# Patient Record
Sex: Female | Born: 1980 | Hispanic: Yes | Marital: Single | Smoking: Never smoker
Health system: Southern US, Community
[De-identification: ages and names within clinical notes are randomized; demographics above are authoritative.]

## PROBLEM LIST (undated history)

## (undated) ENCOUNTER — Inpatient Hospital Stay (HOSPITAL_COMMUNITY): Payer: Self-pay

## (undated) DIAGNOSIS — O139 Gestational [pregnancy-induced] hypertension without significant proteinuria, unspecified trimester: Secondary | ICD-10-CM

## (undated) DIAGNOSIS — N750 Cyst of Bartholin's gland: Secondary | ICD-10-CM

## (undated) HISTORY — PX: THERAPEUTIC ABORTION: SHX798

---

## 2009-10-25 ENCOUNTER — Other Ambulatory Visit: Payer: Self-pay | Admitting: Emergency Medicine

## 2009-10-26 ENCOUNTER — Inpatient Hospital Stay (HOSPITAL_COMMUNITY)
Admission: AD | Admit: 2009-10-26 | Discharge: 2009-10-28 | Payer: Self-pay | Source: Home / Self Care | Admitting: Psychiatry

## 2009-10-26 ENCOUNTER — Emergency Department (HOSPITAL_COMMUNITY): Admission: EM | Admit: 2009-10-26 | Discharge: 2009-10-26 | Payer: Self-pay | Admitting: Emergency Medicine

## 2009-10-26 ENCOUNTER — Ambulatory Visit: Payer: Self-pay | Admitting: Psychiatry

## 2010-10-26 NOTE — L&D Delivery Note (Signed)
Delivery Note At 4:31 PM a viable and healthy female was delivered via Vaginal, Spontaneous Delivery (Presentation: Right Occiput Anterior).  APGAR: 9, 9; weight 8 lb 12.2 oz (3975 g).   Placenta status: Intact, Spontaneous.  Cord: 3 vessels with the following complications: None.  Cord pH: n/a No difficulty with shoulders, though they were tight.  Nuchal cord X 1, reduced prior to delivery of shoulders  NICU team present at birth. Examined baby and did not feel there was significant micrognathia.   Only 100cc amniotic fluid with delivery (s/p ROM)  Anesthesia: Epidural  Episiotomy: None Lacerations: None Suture Repair: n/a Est. Blood Loss (mL):   Mom to postpartum.  Baby to nursery-stable.  Wynelle Bourgeois 09/03/2011, 5:30 PM

## 2011-01-11 LAB — TSH: TSH: 2.43 u[IU]/mL (ref 0.350–4.500)

## 2011-01-26 LAB — ETHANOL: Alcohol, Ethyl (B): 49 mg/dL — ABNORMAL HIGH (ref 0–10)

## 2011-01-26 LAB — BASIC METABOLIC PANEL
BUN: 6 mg/dL (ref 6–23)
CO2: 24 mEq/L (ref 19–32)
Chloride: 110 mEq/L (ref 96–112)
Potassium: 3.1 mEq/L — ABNORMAL LOW (ref 3.5–5.1)

## 2011-01-26 LAB — CBC
HCT: 37.2 % (ref 36.0–46.0)
MCV: 86.7 fL (ref 78.0–100.0)
Platelets: 252 10*3/uL (ref 150–400)
RBC: 4.3 MIL/uL (ref 3.87–5.11)
WBC: 9.5 10*3/uL (ref 4.0–10.5)

## 2011-01-26 LAB — DIFFERENTIAL
Eosinophils Absolute: 0.1 10*3/uL (ref 0.0–0.7)
Eosinophils Relative: 1 % (ref 0–5)
Lymphs Abs: 2.2 10*3/uL (ref 0.7–4.0)
Monocytes Relative: 9 % (ref 3–12)

## 2011-01-26 LAB — RAPID URINE DRUG SCREEN, HOSP PERFORMED
Barbiturates: NOT DETECTED
Benzodiazepines: POSITIVE — AB
Opiates: NOT DETECTED

## 2011-03-02 ENCOUNTER — Other Ambulatory Visit: Payer: Self-pay | Admitting: Family Medicine

## 2011-03-02 DIAGNOSIS — Z3682 Encounter for antenatal screening for nuchal translucency: Secondary | ICD-10-CM

## 2011-03-02 LAB — ANTIBODY SCREEN: Antibody Screen: NEGATIVE

## 2011-03-02 LAB — HEPATITIS B SURFACE ANTIGEN: Hepatitis B Surface Ag: NEGATIVE

## 2011-03-02 LAB — ABO/RH: RH Type: POSITIVE

## 2011-03-02 LAB — RPR: RPR: NONREACTIVE

## 2011-03-02 LAB — RUBELLA ANTIBODY, IGM: Rubella: IMMUNE

## 2011-03-03 ENCOUNTER — Other Ambulatory Visit: Payer: Self-pay | Admitting: Family Medicine

## 2011-03-03 ENCOUNTER — Ambulatory Visit (HOSPITAL_COMMUNITY)
Admission: RE | Admit: 2011-03-03 | Discharge: 2011-03-03 | Disposition: A | Discharge status: Home / Self Care | Payer: Medicaid Other | Source: Ambulatory Visit | Attending: Family Medicine | Admitting: Family Medicine

## 2011-03-03 DIAGNOSIS — O3510X0 Maternal care for (suspected) chromosomal abnormality in fetus, unspecified, not applicable or unspecified: Secondary | ICD-10-CM | POA: Insufficient documentation

## 2011-03-03 DIAGNOSIS — Z3682 Encounter for antenatal screening for nuchal translucency: Secondary | ICD-10-CM

## 2011-03-03 DIAGNOSIS — O351XX0 Maternal care for (suspected) chromosomal abnormality in fetus, not applicable or unspecified: Secondary | ICD-10-CM | POA: Insufficient documentation

## 2011-03-03 DIAGNOSIS — O09299 Supervision of pregnancy with other poor reproductive or obstetric history, unspecified trimester: Secondary | ICD-10-CM | POA: Insufficient documentation

## 2011-03-03 DIAGNOSIS — Z3689 Encounter for other specified antenatal screening: Secondary | ICD-10-CM | POA: Insufficient documentation

## 2011-03-03 DIAGNOSIS — Z0489 Encounter for examination and observation for other specified reasons: Secondary | ICD-10-CM

## 2011-04-21 ENCOUNTER — Encounter (HOSPITAL_COMMUNITY): Payer: Self-pay

## 2011-04-21 ENCOUNTER — Ambulatory Visit (HOSPITAL_COMMUNITY)
Admission: RE | Admit: 2011-04-21 | Discharge: 2011-04-21 | Disposition: A | Discharge status: Home / Self Care | Payer: Medicaid Other | Source: Ambulatory Visit | Attending: Family Medicine | Admitting: Family Medicine

## 2011-04-21 ENCOUNTER — Other Ambulatory Visit: Payer: Self-pay | Admitting: Family Medicine

## 2011-04-21 DIAGNOSIS — O09299 Supervision of pregnancy with other poor reproductive or obstetric history, unspecified trimester: Secondary | ICD-10-CM | POA: Insufficient documentation

## 2011-04-21 DIAGNOSIS — Z363 Encounter for antenatal screening for malformations: Secondary | ICD-10-CM | POA: Insufficient documentation

## 2011-04-21 DIAGNOSIS — Z0489 Encounter for examination and observation for other specified reasons: Secondary | ICD-10-CM

## 2011-04-21 DIAGNOSIS — O358XX0 Maternal care for other (suspected) fetal abnormality and damage, not applicable or unspecified: Secondary | ICD-10-CM | POA: Insufficient documentation

## 2011-04-21 DIAGNOSIS — Z1389 Encounter for screening for other disorder: Secondary | ICD-10-CM | POA: Insufficient documentation

## 2011-06-02 ENCOUNTER — Ambulatory Visit (HOSPITAL_COMMUNITY)
Admission: RE | Admit: 2011-06-02 | Discharge: 2011-06-02 | Disposition: A | Discharge status: Home / Self Care | Payer: Medicaid Other | Source: Ambulatory Visit | Attending: Family Medicine | Admitting: Family Medicine

## 2011-06-02 DIAGNOSIS — O09299 Supervision of pregnancy with other poor reproductive or obstetric history, unspecified trimester: Secondary | ICD-10-CM | POA: Insufficient documentation

## 2011-06-02 DIAGNOSIS — IMO0002 Reserved for concepts with insufficient information to code with codable children: Secondary | ICD-10-CM

## 2011-06-02 DIAGNOSIS — Z0489 Encounter for examination and observation for other specified reasons: Secondary | ICD-10-CM

## 2011-06-02 DIAGNOSIS — Z3689 Encounter for other specified antenatal screening: Secondary | ICD-10-CM | POA: Insufficient documentation

## 2011-06-02 NOTE — Progress Notes (Signed)
Patient seen for ultrasound only appointment today.  Please see [facility] report for details.  

## 2011-06-19 ENCOUNTER — Emergency Department (HOSPITAL_COMMUNITY)
Admission: EM | Admit: 2011-06-19 | Discharge: 2011-06-20 | Disposition: A | Discharge status: Home / Self Care | Payer: Medicaid Other | Attending: Emergency Medicine | Admitting: Emergency Medicine

## 2011-06-19 DIAGNOSIS — O99891 Other specified diseases and conditions complicating pregnancy: Secondary | ICD-10-CM | POA: Insufficient documentation

## 2011-06-19 DIAGNOSIS — R0789 Other chest pain: Secondary | ICD-10-CM | POA: Insufficient documentation

## 2011-06-19 DIAGNOSIS — R000 Tachycardia, unspecified: Secondary | ICD-10-CM | POA: Insufficient documentation

## 2011-06-19 DIAGNOSIS — K219 Gastro-esophageal reflux disease without esophagitis: Secondary | ICD-10-CM | POA: Insufficient documentation

## 2011-06-19 DIAGNOSIS — R1013 Epigastric pain: Secondary | ICD-10-CM | POA: Insufficient documentation

## 2011-06-20 ENCOUNTER — Emergency Department (HOSPITAL_COMMUNITY): Payer: Medicaid Other

## 2011-06-20 LAB — COMPREHENSIVE METABOLIC PANEL
CO2: 22 mEq/L (ref 19–32)
Calcium: 8.8 mg/dL (ref 8.4–10.5)
Creatinine, Ser: <0.47 mg/dL — ABNORMAL LOW (ref 0.50–1.10)
Glucose, Bld: 101 mg/dL — ABNORMAL HIGH (ref 70–99)

## 2011-06-20 LAB — CBC
HCT: 35.1 % — ABNORMAL LOW (ref 36.0–46.0)
Hemoglobin: 12 g/dL (ref 12.0–15.0)
RDW: 13.9 % (ref 11.5–15.5)
WBC: 17.1 10*3/uL — ABNORMAL HIGH (ref 4.0–10.5)

## 2011-06-20 LAB — DIFFERENTIAL
Basophils Absolute: 0.1 10*3/uL (ref 0.0–0.1)
Eosinophils Relative: 2 % (ref 0–5)
Lymphocytes Relative: 11 % — ABNORMAL LOW (ref 12–46)
Neutro Abs: 13.5 10*3/uL — ABNORMAL HIGH (ref 1.7–7.7)

## 2011-06-26 LAB — HIV ANTIBODY (ROUTINE TESTING W REFLEX): HIV: NONREACTIVE

## 2011-08-07 ENCOUNTER — Other Ambulatory Visit: Payer: Self-pay | Admitting: Family Medicine

## 2011-08-07 ENCOUNTER — Other Ambulatory Visit (HOSPITAL_COMMUNITY): Payer: Self-pay | Admitting: Physician Assistant

## 2011-08-07 DIAGNOSIS — Z0489 Encounter for examination and observation for other specified reasons: Secondary | ICD-10-CM

## 2011-08-11 ENCOUNTER — Ambulatory Visit (HOSPITAL_COMMUNITY)
Admission: RE | Admit: 2011-08-11 | Discharge: 2011-08-11 | Disposition: A | Discharge status: Home / Self Care | Payer: Medicaid Other | Source: Ambulatory Visit | Attending: Family Medicine | Admitting: Family Medicine

## 2011-08-11 DIAGNOSIS — O09299 Supervision of pregnancy with other poor reproductive or obstetric history, unspecified trimester: Secondary | ICD-10-CM | POA: Insufficient documentation

## 2011-08-11 DIAGNOSIS — Z3689 Encounter for other specified antenatal screening: Secondary | ICD-10-CM | POA: Insufficient documentation

## 2011-08-11 DIAGNOSIS — Z0489 Encounter for examination and observation for other specified reasons: Secondary | ICD-10-CM

## 2011-08-11 NOTE — Progress Notes (Signed)
Obstetric ultrasound performed today.  Please see report in [facility]. 

## 2011-08-12 ENCOUNTER — Encounter (HOSPITAL_COMMUNITY): Payer: Self-pay

## 2011-08-12 NOTE — Progress Notes (Signed)
Encounter addended by: Marlana Latus, RN on: 08/12/2011  2:59 PM<BR>     Documentation filed: Chief Complaint Section, Episodes

## 2011-08-25 ENCOUNTER — Ambulatory Visit (HOSPITAL_COMMUNITY)
Admission: RE | Admit: 2011-08-25 | Discharge: 2011-08-25 | Disposition: A | Discharge status: Home / Self Care | Payer: Medicaid Other | Source: Ambulatory Visit | Attending: Family Medicine | Admitting: Family Medicine

## 2011-08-25 ENCOUNTER — Other Ambulatory Visit (HOSPITAL_COMMUNITY): Payer: Self-pay | Admitting: Maternal and Fetal Medicine

## 2011-08-25 DIAGNOSIS — Z0489 Encounter for examination and observation for other specified reasons: Secondary | ICD-10-CM

## 2011-08-25 DIAGNOSIS — O09299 Supervision of pregnancy with other poor reproductive or obstetric history, unspecified trimester: Secondary | ICD-10-CM | POA: Insufficient documentation

## 2011-08-25 DIAGNOSIS — Z3689 Encounter for other specified antenatal screening: Secondary | ICD-10-CM | POA: Insufficient documentation

## 2011-09-02 ENCOUNTER — Inpatient Hospital Stay (HOSPITAL_COMMUNITY): Payer: Medicaid Other

## 2011-09-02 ENCOUNTER — Inpatient Hospital Stay (HOSPITAL_COMMUNITY)
Admission: AD | Admit: 2011-09-02 | Discharge: 2011-09-05 | DRG: 774 | Disposition: A | Discharge status: Home / Self Care | Payer: Medicaid Other | Source: Ambulatory Visit | Attending: Obstetrics & Gynecology | Admitting: Obstetrics & Gynecology

## 2011-09-02 ENCOUNTER — Encounter (HOSPITAL_COMMUNITY): Payer: Self-pay | Admitting: *Deleted

## 2011-09-02 DIAGNOSIS — O409XX0 Polyhydramnios, unspecified trimester, not applicable or unspecified: Secondary | ICD-10-CM | POA: Diagnosis present

## 2011-09-02 DIAGNOSIS — O09899 Supervision of other high risk pregnancies, unspecified trimester: Secondary | ICD-10-CM

## 2011-09-02 DIAGNOSIS — O0990 Supervision of high risk pregnancy, unspecified, unspecified trimester: Secondary | ICD-10-CM

## 2011-09-02 DIAGNOSIS — Y92009 Unspecified place in unspecified non-institutional (private) residence as the place of occurrence of the external cause: Secondary | ICD-10-CM

## 2011-09-02 DIAGNOSIS — Z87898 Personal history of other specified conditions: Secondary | ICD-10-CM

## 2011-09-02 DIAGNOSIS — O4590 Premature separation of placenta, unspecified, unspecified trimester: Principal | ICD-10-CM | POA: Diagnosis present

## 2011-09-02 DIAGNOSIS — IMO0002 Reserved for concepts with insufficient information to code with codable children: Secondary | ICD-10-CM

## 2011-09-02 DIAGNOSIS — O289 Unspecified abnormal findings on antenatal screening of mother: Secondary | ICD-10-CM | POA: Diagnosis present

## 2011-09-02 DIAGNOSIS — W19XXXA Unspecified fall, initial encounter: Secondary | ICD-10-CM | POA: Diagnosis present

## 2011-09-02 DIAGNOSIS — W010XXA Fall on same level from slipping, tripping and stumbling without subsequent striking against object, initial encounter: Secondary | ICD-10-CM | POA: Diagnosis present

## 2011-09-02 LAB — CBC
HCT: 36.8 % (ref 36.0–46.0)
Hemoglobin: 11.9 g/dL — ABNORMAL LOW (ref 12.0–15.0)
MCH: 29 pg (ref 26.0–34.0)
MCHC: 32.3 g/dL (ref 30.0–36.0)
MCV: 89.5 fL (ref 78.0–100.0)

## 2011-09-02 MED ORDER — OXYTOCIN 20 UNITS IN LACTATED RINGERS INFUSION - SIMPLE
125.0000 mL/h | Freq: Once | INTRAVENOUS | Status: AC
  Administered 2011-09-03: 125 mL/h via INTRAVENOUS

## 2011-09-02 MED ORDER — ONDANSETRON HCL 4 MG/2ML IJ SOLN: 4.0000 mg | Freq: Four times a day (QID) | INTRAMUSCULAR | Status: DC | PRN

## 2011-09-02 MED ORDER — HYDROXYZINE HCL 50 MG PO TABS: 50.0000 mg | ORAL_TABLET | Freq: Four times a day (QID) | ORAL | Status: DC | PRN

## 2011-09-02 MED ORDER — LIDOCAINE HCL (PF) 1 % IJ SOLN
30.0000 mL | INTRAMUSCULAR | Status: DC | PRN
  Filled 2011-09-02: qty 30

## 2011-09-02 MED ORDER — LACTATED RINGERS IV SOLN
500.0000 mL | INTRAVENOUS | Status: DC | PRN
  Administered 2011-09-03: 1000 mL via INTRAVENOUS

## 2011-09-02 MED ORDER — OXYTOCIN BOLUS FROM INFUSION
500.0000 mL | Freq: Once | INTRAVENOUS | Status: DC
  Filled 2011-09-02: qty 500

## 2011-09-02 MED ORDER — OXYTOCIN 20 UNITS IN LACTATED RINGERS INFUSION - SIMPLE
1.0000 m[IU]/min | INTRAVENOUS | Status: DC
  Administered 2011-09-03: 2 m[IU]/min via INTRAVENOUS
  Filled 2011-09-02: qty 1000

## 2011-09-02 MED ORDER — CITRIC ACID-SODIUM CITRATE 334-500 MG/5ML PO SOLN: 30.0000 mL | ORAL | Status: DC | PRN

## 2011-09-02 MED ORDER — IBUPROFEN 600 MG PO TABS: 600.0000 mg | ORAL_TABLET | Freq: Four times a day (QID) | ORAL | Status: DC | PRN

## 2011-09-02 MED ORDER — OXYCODONE-ACETAMINOPHEN 5-325 MG PO TABS: 2.0000 | ORAL_TABLET | ORAL | Status: DC | PRN

## 2011-09-02 MED ORDER — ZOLPIDEM TARTRATE 10 MG PO TABS: 10.0000 mg | ORAL_TABLET | Freq: Every evening | ORAL | Status: DC | PRN

## 2011-09-02 MED ORDER — TERBUTALINE SULFATE 1 MG/ML IJ SOLN: 0.2500 mg | Freq: Once | INTRAMUSCULAR | Status: AC | PRN

## 2011-09-02 MED ORDER — FLEET ENEMA 7-19 GM/118ML RE ENEM: 1.0000 | ENEMA | RECTAL | Status: DC | PRN

## 2011-09-02 MED ORDER — LACTATED RINGERS IV SOLN
INTRAVENOUS | Status: DC
  Administered 2011-09-02 – 2011-09-03 (×3): via INTRAVENOUS

## 2011-09-02 MED ORDER — ACETAMINOPHEN 325 MG PO TABS: 650.0000 mg | ORAL_TABLET | ORAL | Status: DC | PRN

## 2011-09-02 MED ORDER — HYDROXYZINE HCL 50 MG/ML IM SOLN: 50.0000 mg | Freq: Four times a day (QID) | INTRAMUSCULAR | Status: DC | PRN

## 2011-09-02 MED ORDER — NALBUPHINE SYRINGE 5 MG/0.5 ML: 5.0000 mg | INJECTION | INTRAMUSCULAR | Status: DC | PRN

## 2011-09-02 NOTE — Plan of Care (Signed)
Problem: Consults Goal: Birthing Suites Patient Information Press F2 to bring up selections list Outcome: Completed/Met Date Met:  09/02/11  Pt 37-[redacted] weeks EGA, Inpatient induction, Fetal indication and Other (specify with a note) pt fell at home & has partial abruption now.

## 2011-09-02 NOTE — Progress Notes (Signed)
Pt admitted for IOL 2/2 presumed marginal abruption after a fall.  U/S showed no evidence of abruption.  FHR 140'S, AVG LTV, + accels, no decels.  No contractions.  CX 2-3/long/-2/vtx.  Foley placed and inflated with 60 ccc H20.  Will start Pitocin when foley falls out or 0500, whichever comes first.
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
## 2011-09-02 NOTE — Progress Notes (Signed)
Patient states that she fell going down stairs at about 0730 and hit on her buttocks. Started having abdominal pain and leaking a little fluid and having spotting, started as red not more brown. Reports good fetal movement.

## 2011-09-02 NOTE — Progress Notes (Addendum)
Anita Harrison is a 30 y.o. G3P1011 at [redacted]w[redacted]d.  Subjective: Mild-mod low abd cramping, worsening  Objective: BP 126/77  Pulse 91  Temp(Src) 97.2 F (36.2 C) (Oral)  Resp 16  Ht 5' 4.5" (1.638 m)  Wt 93.622 kg (206 lb 6.4 oz)  BMI 34.88 kg/m2  SpO2 98%  LMP 12/07/2010      FHT:  FHR: 130 bpm, variability: moderate,  accelerations:  Present,  decelerations:  Absent UC:   irregular SVE:   Dilation: 3 Effacement (%):  (long) Exam by:: V. Shakim Faith, cnm Abd soft, NT Pelvic: mod amount of thick, light-brown discharge. [place]: No evidence of abruption  Labs: Lab Results  Component Value Date   WBC 17.1* 06/20/2011   HGB 12.0 06/20/2011   HCT 35.1* 06/20/2011   MCV 88.0 06/20/2011   PLT 228 06/20/2011    Assessment / Plan: Plan IOL for suspected abruption after fall  Labor: NA Preeclampsia:  NA Fetal Wellbeing:  Category I Pain Control:  Labor support without medications I/D:  n/a Anticipated MOD:  NSVD  Christie Copley 09/02/2011, 7:29 PM

## 2011-09-03 ENCOUNTER — Encounter (HOSPITAL_COMMUNITY): Payer: Self-pay | Admitting: *Deleted

## 2011-09-03 ENCOUNTER — Encounter (HOSPITAL_COMMUNITY): Payer: Self-pay | Admitting: Anesthesiology

## 2011-09-03 ENCOUNTER — Inpatient Hospital Stay (HOSPITAL_COMMUNITY): Payer: Medicaid Other | Admitting: Anesthesiology

## 2011-09-03 DIAGNOSIS — O0990 Supervision of high risk pregnancy, unspecified, unspecified trimester: Secondary | ICD-10-CM

## 2011-09-03 DIAGNOSIS — O4590 Premature separation of placenta, unspecified, unspecified trimester: Secondary | ICD-10-CM

## 2011-09-03 MED ORDER — PHENYLEPHRINE 40 MCG/ML (10ML) SYRINGE FOR IV PUSH (FOR BLOOD PRESSURE SUPPORT)
80.0000 ug | PREFILLED_SYRINGE | INTRAVENOUS | Status: DC | PRN
  Filled 2011-09-03: qty 5

## 2011-09-03 MED ORDER — DIPHENHYDRAMINE HCL 50 MG/ML IJ SOLN: 12.5000 mg | INTRAMUSCULAR | Status: DC | PRN

## 2011-09-03 MED ORDER — TETANUS-DIPHTH-ACELL PERTUSSIS 5-2.5-18.5 LF-MCG/0.5 IM SUSP
0.5000 mL | Freq: Once | INTRAMUSCULAR | Status: AC
  Administered 2011-09-04: 0.5 mL via INTRAMUSCULAR
  Filled 2011-09-03: qty 0.5

## 2011-09-03 MED ORDER — EPHEDRINE 5 MG/ML INJ
10.0000 mg | INTRAVENOUS | Status: DC | PRN
  Filled 2011-09-03: qty 4

## 2011-09-03 MED ORDER — ONDANSETRON HCL 4 MG PO TABS: 4.0000 mg | ORAL_TABLET | ORAL | Status: DC | PRN

## 2011-09-03 MED ORDER — PRENATAL PLUS 27-1 MG PO TABS
1.0000 | ORAL_TABLET | Freq: Every day | ORAL | Status: DC
  Administered 2011-09-04: 1 via ORAL
  Filled 2011-09-03: qty 1

## 2011-09-03 MED ORDER — BENZOCAINE-MENTHOL 20-0.5 % EX AERO: 1.0000 "application " | INHALATION_SPRAY | CUTANEOUS | Status: DC | PRN

## 2011-09-03 MED ORDER — DIBUCAINE 1 % RE OINT: 1.0000 "application " | TOPICAL_OINTMENT | RECTAL | Status: DC | PRN

## 2011-09-03 MED ORDER — LANOLIN HYDROUS EX OINT: TOPICAL_OINTMENT | CUTANEOUS | Status: DC | PRN

## 2011-09-03 MED ORDER — OXYCODONE-ACETAMINOPHEN 5-325 MG PO TABS: 1.0000 | ORAL_TABLET | ORAL | Status: DC | PRN

## 2011-09-03 MED ORDER — SENNOSIDES-DOCUSATE SODIUM 8.6-50 MG PO TABS
2.0000 | ORAL_TABLET | Freq: Every day | ORAL | Status: DC
  Administered 2011-09-03 – 2011-09-04 (×2): 2 via ORAL

## 2011-09-03 MED ORDER — FENTANYL 2.5 MCG/ML BUPIVACAINE 1/10 % EPIDURAL INFUSION (WH - ANES)
INTRAMUSCULAR | Status: DC | PRN
  Administered 2011-09-03: 14 mL/h via EPIDURAL

## 2011-09-03 MED ORDER — FENTANYL 2.5 MCG/ML BUPIVACAINE 1/10 % EPIDURAL INFUSION (WH - ANES)
14.0000 mL/h | INTRAMUSCULAR | Status: DC
  Filled 2011-09-03: qty 60

## 2011-09-03 MED ORDER — LACTATED RINGERS IV SOLN
500.0000 mL | Freq: Once | INTRAVENOUS | Status: AC
  Administered 2011-09-03: 500 mL via INTRAVENOUS

## 2011-09-03 MED ORDER — ONDANSETRON HCL 4 MG/2ML IJ SOLN: 4.0000 mg | INTRAMUSCULAR | Status: DC | PRN

## 2011-09-03 MED ORDER — EPHEDRINE 5 MG/ML INJ: 10.0000 mg | INTRAVENOUS | Status: DC | PRN

## 2011-09-03 MED ORDER — IBUPROFEN 600 MG PO TABS
600.0000 mg | ORAL_TABLET | Freq: Four times a day (QID) | ORAL | Status: DC
  Administered 2011-09-04 – 2011-09-05 (×6): 600 mg via ORAL
  Filled 2011-09-03 (×6): qty 1

## 2011-09-03 MED ORDER — WITCH HAZEL-GLYCERIN EX PADS: 1.0000 "application " | MEDICATED_PAD | CUTANEOUS | Status: DC | PRN

## 2011-09-03 MED ORDER — SODIUM BICARBONATE 8.4 % IV SOLN
INTRAVENOUS | Status: DC | PRN
  Administered 2011-09-03: 5 mL via EPIDURAL

## 2011-09-03 MED ORDER — PRENATAL PLUS 27-1 MG PO TABS: 1.0000 | ORAL_TABLET | Freq: Every day | ORAL | Status: DC

## 2011-09-03 MED ORDER — PHENYLEPHRINE 40 MCG/ML (10ML) SYRINGE FOR IV PUSH (FOR BLOOD PRESSURE SUPPORT): 80.0000 ug | PREFILLED_SYRINGE | INTRAVENOUS | Status: DC | PRN

## 2011-09-03 MED ORDER — SIMETHICONE 80 MG PO CHEW: 80.0000 mg | CHEWABLE_TABLET | ORAL | Status: DC | PRN

## 2011-09-03 MED ORDER — DIPHENHYDRAMINE HCL 25 MG PO CAPS: 25.0000 mg | ORAL_CAPSULE | Freq: Four times a day (QID) | ORAL | Status: DC | PRN

## 2011-09-03 MED ORDER — ZOLPIDEM TARTRATE 5 MG PO TABS: 5.0000 mg | ORAL_TABLET | Freq: Every evening | ORAL | Status: DC | PRN

## 2011-09-03 NOTE — Anesthesia Preprocedure Evaluation (Signed)
Anesthesia Evaluation  Patient identified by MRN, date of birth, ID band Patient awake    Reviewed: Allergy & Precautions, H&P , Patient's Chart, lab work & pertinent test results  Airway Mallampati: II TM Distance: >3 FB Neck ROM: full    Dental  (+) Teeth Intact   Pulmonary  clear to auscultation        Cardiovascular regular Normal    Neuro/Psych    GI/Hepatic   Endo/Other    Renal/GU      Musculoskeletal   Abdominal   Peds  Hematology   Anesthesia Other Findings       Reproductive/Obstetrics (+) Pregnancy                           Anesthesia Physical Anesthesia Plan  ASA: III  Anesthesia Plan: Epidural   Post-op Pain Management:    Induction:   Airway Management Planned:   Additional Equipment:   Intra-op Plan:   Post-operative Plan:   Informed Consent:   Plan Discussed with:   Anesthesia Plan Comments:         Anesthesia Quick Evaluation

## 2011-09-03 NOTE — Progress Notes (Signed)
Foley out--CX 5/50/-2/vtx.  FHR reactive without decels.  No contractions. Will start pitocin

## 2011-09-03 NOTE — Anesthesia Procedure Notes (Signed)
Epidural Patient location during procedure: OB  Preanesthetic Checklist Completed: patient identified, site marked, surgical consent, pre-op evaluation, timeout performed, IV checked, risks and benefits discussed and monitors and equipment checked  Epidural Patient position: sitting Prep: site prepped and draped and DuraPrep Patient monitoring: continuous pulse ox and blood pressure Approach: midline Injection technique: LOR air  Needle:  Needle type: Tuohy  Needle gauge: 17 G Needle length: 9 cm Needle insertion depth: 6 cm Catheter type: closed end flexible Catheter size: 19 Gauge Catheter at skin depth: 12 cm Test dose: negative  Assessment Events: blood not aspirated, injection not painful, no injection resistance, negative IV test and no paresthesia  Additional Notes Dosing of Epidural:  1st dose, through needle ............................................. epi 1:200K + Xylocaine 40 mg  2nd dose, through catheter, after waiting 3 minutes.....epi 1:200K + Xylocaine 60 mg  3rd dose, through catheter after waiting 3 minutes .............................Marcaine   5mg   ( mg Marcaine are expressed as equivilent  cc's medication removed from the 0.1%Bupiv / fentanyl syringe from L&D pump)  ( 2% Xylo charted as a single dose in Epic Meds for ease of charting; actual dosing was fractionated as above, for saftey's sake)  As each dose occurred, patient was free of IV sx; and patient exhibited no evidence of SA injection.  Patient is more comfortable after epidural dosed. Please see RN's note for documentation of vital signs,and FHR which are stable.       

## 2011-09-03 NOTE — Progress Notes (Signed)
Pt sleeping.  Only brownish blood when pt wipes after using bathroom. FHR reactive without decels.  Foley still in.

## 2011-09-03 NOTE — Progress Notes (Signed)
Pt sleeping.  FHR 140's, avg LTV, + accels, no decels.  Occ contraction.  Foley still in.

## 2011-09-04 NOTE — Anesthesia Postprocedure Evaluation (Signed)
  Anesthesia Post-op Note  Patient: Anita Harrison  Procedure(s) Performed: * No procedures listed *  Patient Location: Mother/Baby  Anesthesia Type: Epidural  Level of Consciousness: awake, alert  and oriented  Airway and Oxygen Therapy: Patient Spontanous Breathing  Post-op Pain: none  Post-op Assessment: Post-op Vital signs reviewed, Patient's Cardiovascular Status Stable, No headache, No backache, No residual numbness and No residual motor weakness  Post-op Vital Signs: Reviewed and stable  Complications: No apparent anesthesia complications

## 2011-09-04 NOTE — Progress Notes (Signed)
UR chart review completed.  

## 2011-09-04 NOTE — Progress Notes (Signed)
Post Partum Day 1 for SVD  Subjective: no complaints, up ad lib, voiding, tolerating PO and + flatus, denies headache, changes in vision, abdominal pain in RUQ, shortness of breath  Objective: Blood pressure 131/79, pulse 99, temperature 98.5 F (36.9 C), temperature source Oral, resp. rate 16, height 5' 4.5" (1.638 m), weight 93.622 kg (206 lb 6.4 oz), last menstrual period 12/07/2010, SpO2 100.00%, unknown if currently breastfeeding.  Temp:  [97.8 F (36.6 C)-98.6 F (37 C)] 98.5 F (36.9 C) (11/09 0036) Pulse Rate:  [73-122] 99  (11/09 0036) Resp:  [16-20] 16  (11/09 0036) BP: (95-168)/(58-109) 131/79 mmHg (11/09 0036) SpO2:  [99 %-100 %] 100 % (11/08 1314)   Physical Exam:  General: alert, cooperative, appears stated age and no distress Lochia: appropriate Uterine Fundus: firm DVT Evaluation: No evidence of DVT seen on physical exam. Cardiac: RRR, no murmurs Lungs: CTA-B Neuro: DTR 2+ bilaterally    Basename 09/02/11 1942  HGB 11.9*  HCT 36.8    Assessment/Plan: Plan for discharge tomorrow and Breastfeeding   LOS: 2 days   Mat Carne 09/04/2011, 7:31 AM

## 2011-09-04 NOTE — H&P (Signed)
I was present for the exam and agree with above.  Dorathy Kinsman 09/04/2011 6:59 AM

## 2011-09-05 MED ORDER — IBUPROFEN 600 MG PO TABS: 600.0000 mg | ORAL_TABLET | Freq: Four times a day (QID) | ORAL | Status: AC

## 2011-09-05 NOTE — Discharge Summary (Signed)
Obstetric Discharge Summary Reason for Admission: induction of labor and Post- fall w/ suspicion for abruption Prenatal Procedures: ultrasound Intrapartum Procedures: spontaneous vaginal delivery Postpartum Procedures: none Complications-Operative and Postpartum: none Hemoglobin  Date Value Range Status  09/02/2011 11.9* 12.0-15.0 (g/dL) Final     HCT  Date Value Range Status  09/02/2011 36.8  36.0-46.0 (%) Final    Discharge Diagnoses: Term Pregnancy-delivered  Discharge Information: Date: 09/05/2011 Activity: pelvic rest Diet: routine Medications: Ibuprofen Condition: stable Instructions: refer to practice specific booklet Discharge to: home Follow-up Information    Follow up with [facility] [place] in 5 weeks. (or [facility] as needed)    Contact information:   [address] [employer] [place] [address]          Newborn Data: Live born female  Birth Weight: 8 lb 12.2 oz (3975 g) APGAR: 9, 9  Home with mother.  Anita Harrison 09/05/2011, 9:32 AM

## 2011-09-05 NOTE — Progress Notes (Signed)
Post Partum Day 2 Subjective: no complaints, up ad lib, voiding, tolerating PO and + flatus  Objective: Blood pressure 101/65, pulse 88, temperature 97.6 F (36.4 C), temperature source Axillary, resp. rate 18, height 5' 4.5" (1.638 m), weight 93.622 kg (206 lb 6.4 oz), last menstrual period 12/07/2010, SpO2 100.00%, unknown if currently breastfeeding.  Physical Exam:  General: alert, cooperative and no distress Lochia: appropriate Uterine Fundus: firm Incision: NA DVT Evaluation: No evidence of DVT seen on physical exam.   Basename 09/02/11 1942  HGB 11.9*  HCT 36.8    Assessment/Plan: Discharge home, Breastfeeding and Contraception Mirena   LOS: 3 days   Anita Harrison 09/05/2011, 9:26 AM

## 2011-09-05 NOTE — Discharge Summary (Signed)
Attestation of Attending Supervision of Advanced Practitioner: Evaluation and management procedures were performed by the PA/NP/CNM/OB Fellow under my supervision/collaboration. Chart reviewed, and agree with management and plan.  Jaynie Collins, M.D. 09/05/2011 9:42 AM
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
## 2012-04-11 IMAGING — US US ABDOMEN COMPLETE
1 series · 13 of 25 positions shown · non-contrast
Comparison: None.

CLINICAL DATA: Chest pain, evaluate for gallstones.  The patient
is 7 months pregnant.

COMPLETE ABDOMINAL ULTRASOUND

[Series 1: us abdomen complete · 0.32mm/px · 13 of 54 slices shown]
[im 1/54]
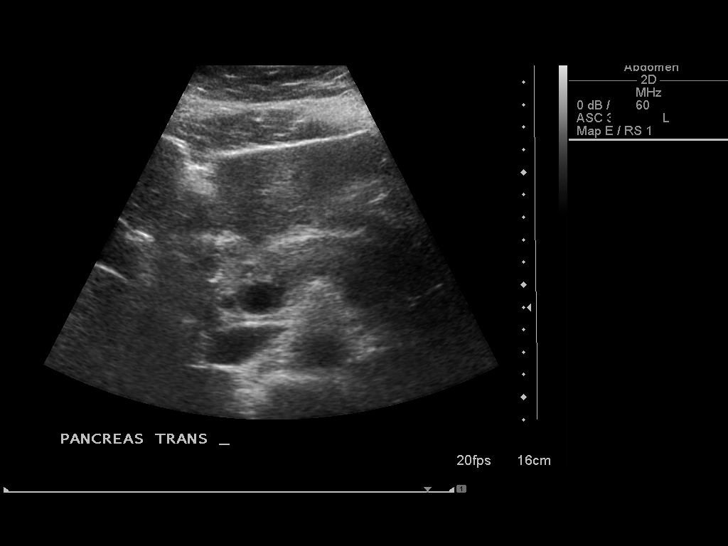
[im 5/54]
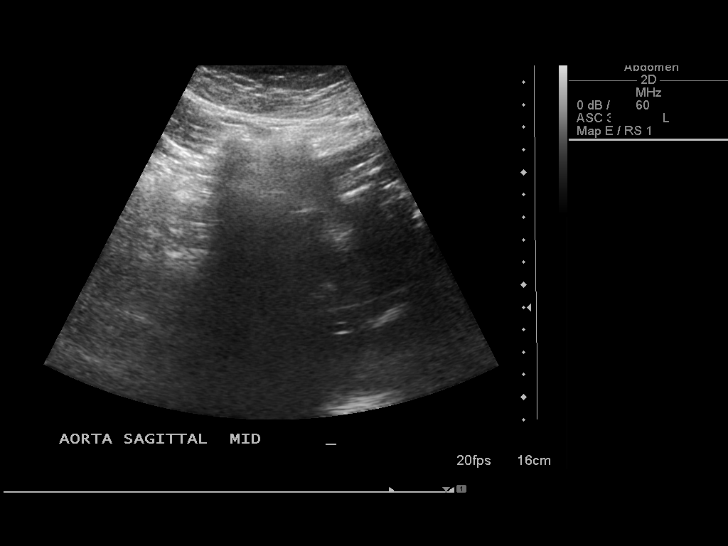
[im 9/54]
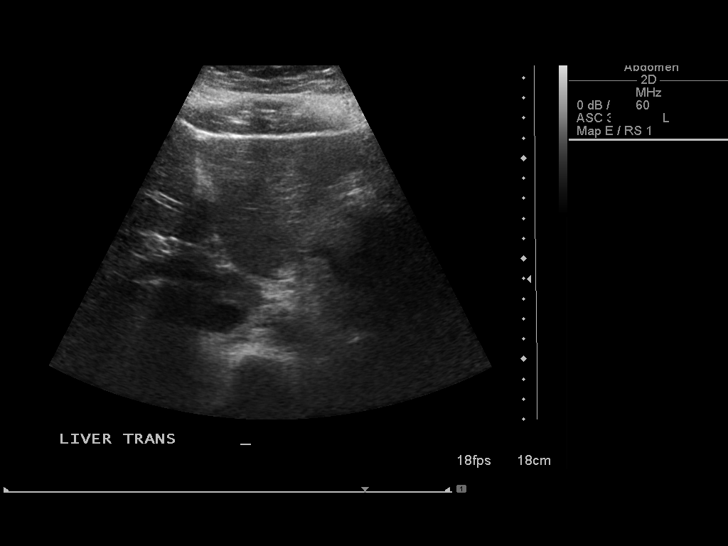
[im 14/54]
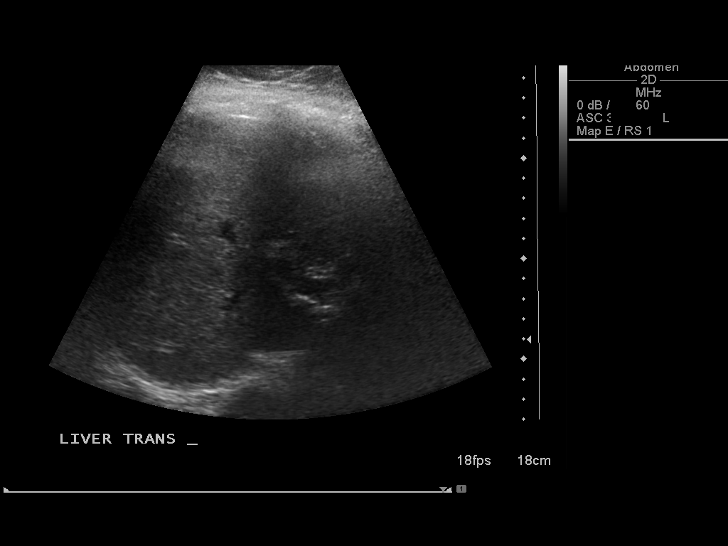
[im 18/54]
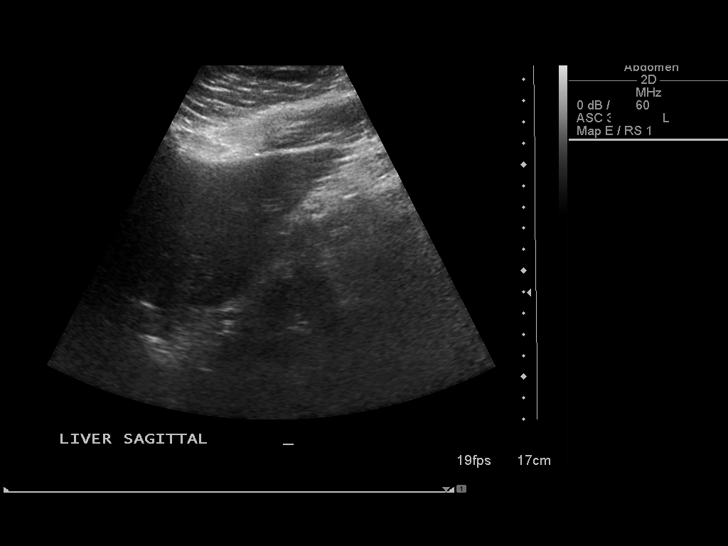
[im 23/54]
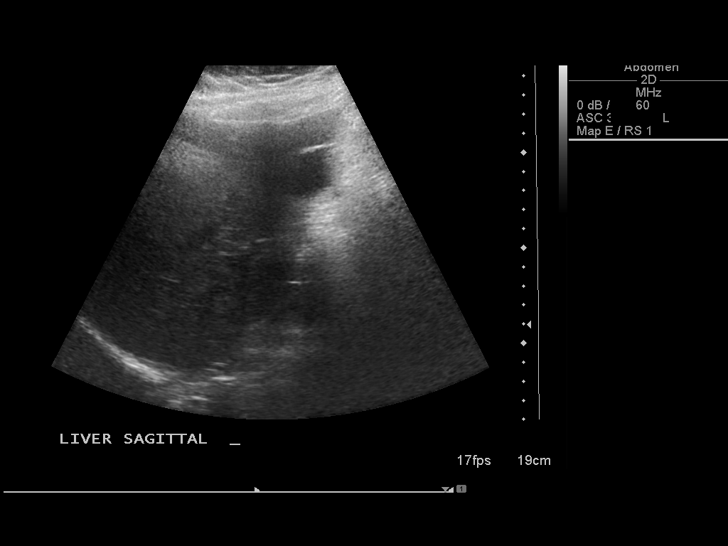
[im 27/54]
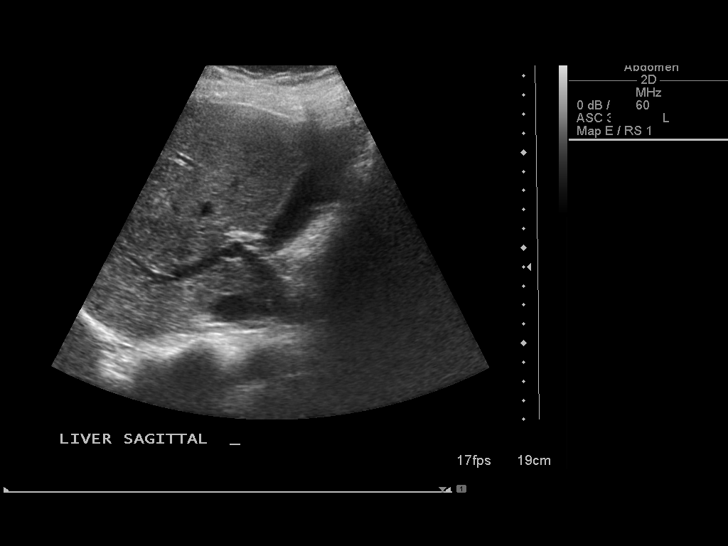
[im 31/54]
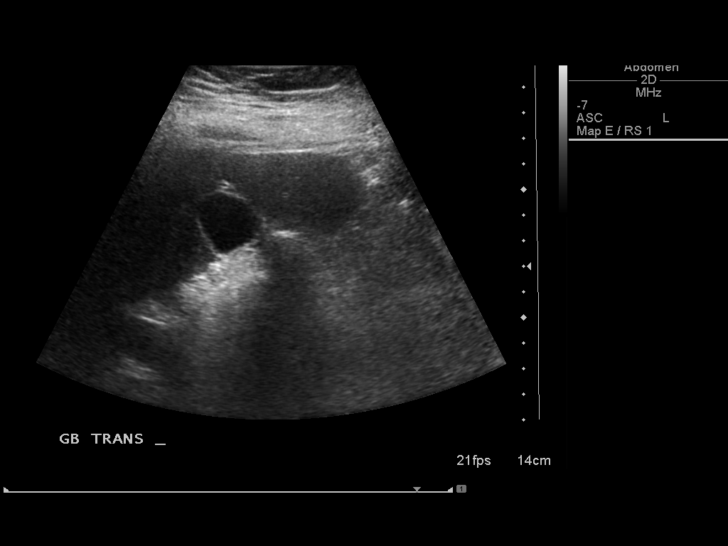
[im 36/54]
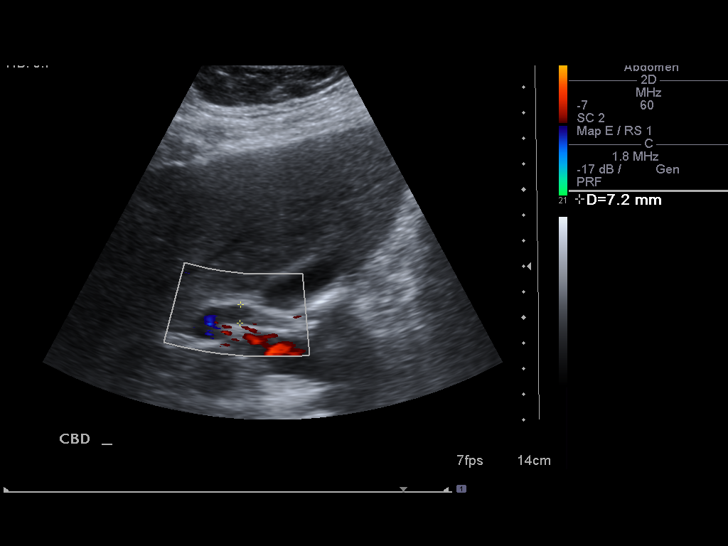
[im 40/54]
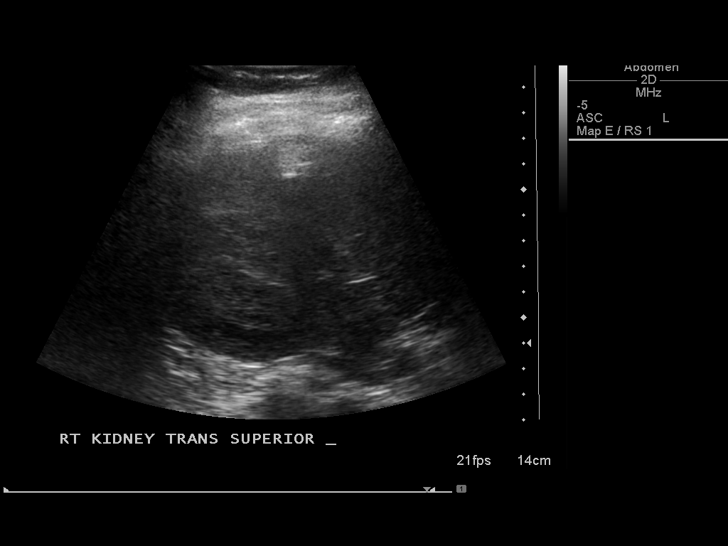
[im 45/54]
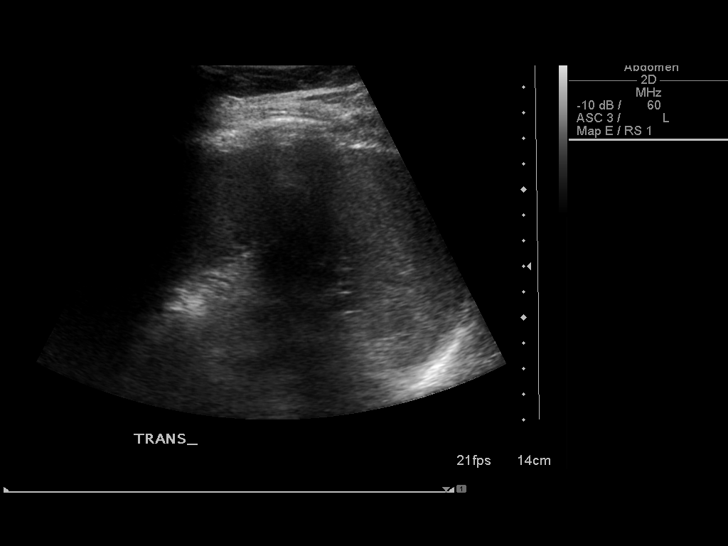
[im 49/54]
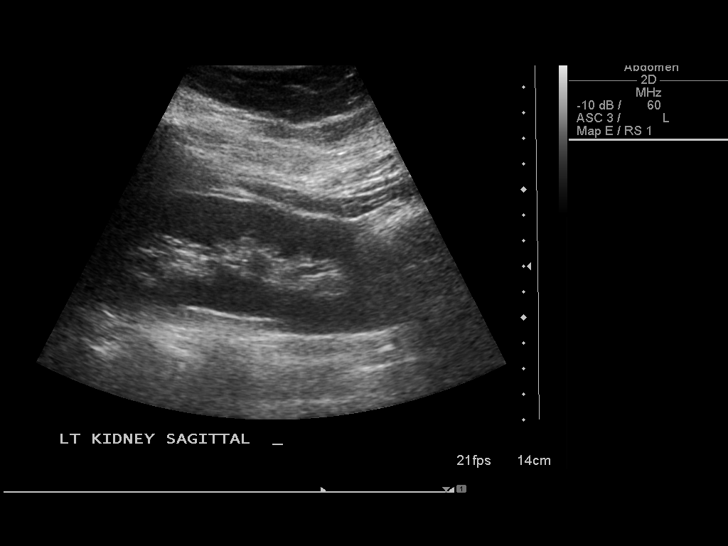
[im 54/54]
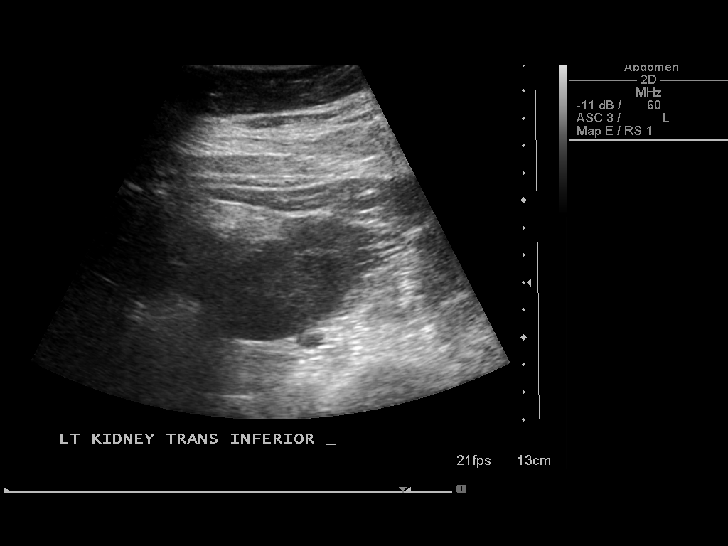

[13 of 25 positions shown; findings below may reference images not displayed]

FINDINGS: Gallbladder:  No gallstones, gallbladder wall thickening, or
pericholecystic fluid.

Common bile duct:  Slightly prominent proximally at 7 mm.  No
obstructing lesion identified.  The distal duct is obscured by
overlying bowel gas artifact.

Liver:  No focal lesion identified.  Within normal limits in
parenchymal echogenicity.

IVC:  Appears normal.

Pancreas:  Limited visualization of the distal body and tail
secondary to overlying bowel gas artifact.  No focal abnormality
appreciated within the proximal portions.

Spleen:  Mildly prominent, measuring up to 13.7 cm.

Right Kidney:  Normal echogenicity, measuring 11.2 cm.  No
hydronephrosis.

Left Kidney:  Normal echogenicity.  No hydronephrosis.  Measures
14.0 cm.

Abdominal aorta:  Limited visualization of the mid and distal aorta
secondary to overlying bowel gas and the gravid uterus.
IMPRESSION: No gallstones or sonographic evidence for cholecystitis.

Mildly prominent common bile duct up to 7 mm.  Appears to taper as
expected toward the ampulla however the distal aspect is obscured
by overlying bowel gas artifact. Correlate with LFTs.

Mild splenomegaly.

## 2012-06-16 IMAGING — US US OB LIMITED
1 series · 14 of 28 positions shown · non-contrast
Comparison: none

[Series 1: us ob limited · 0.23mm/px · 33 acquisitions, 14 frames shown]
[im 2/33]
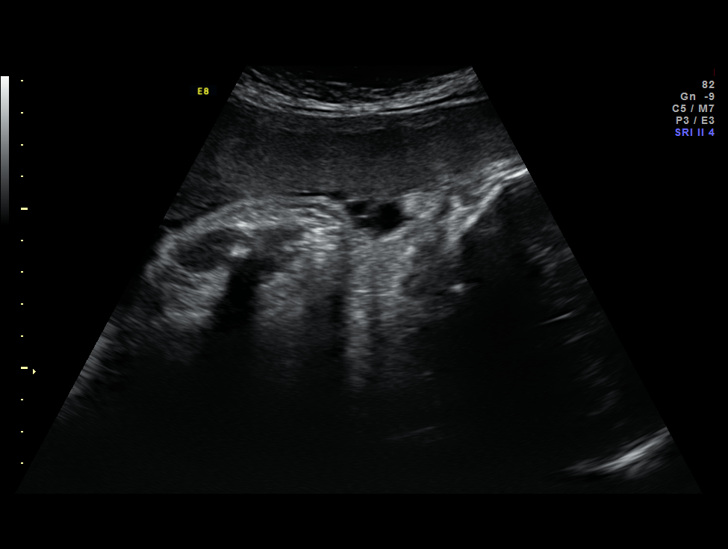
[im 4/33]
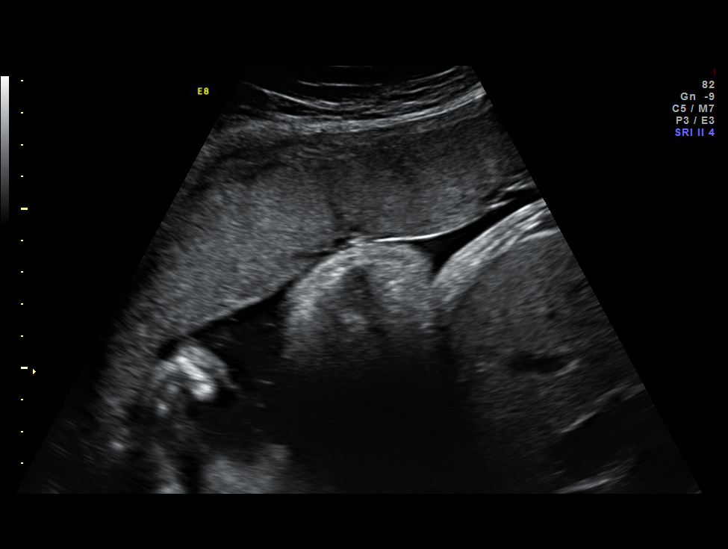
[im 6/33]
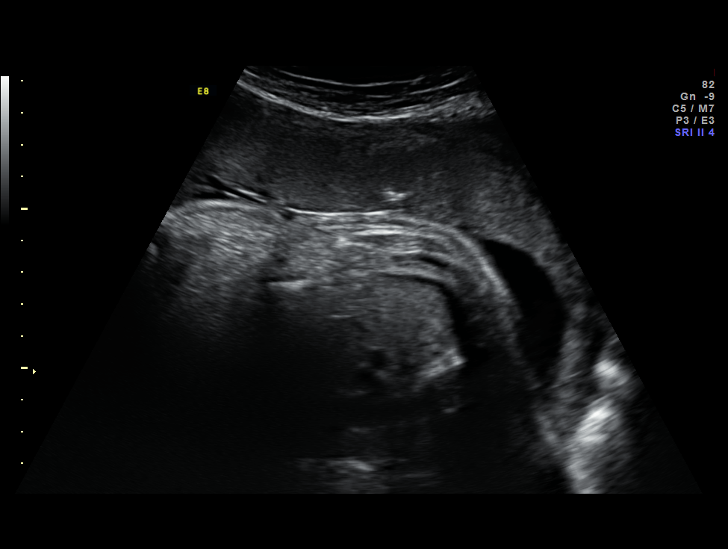
[im 9/33]
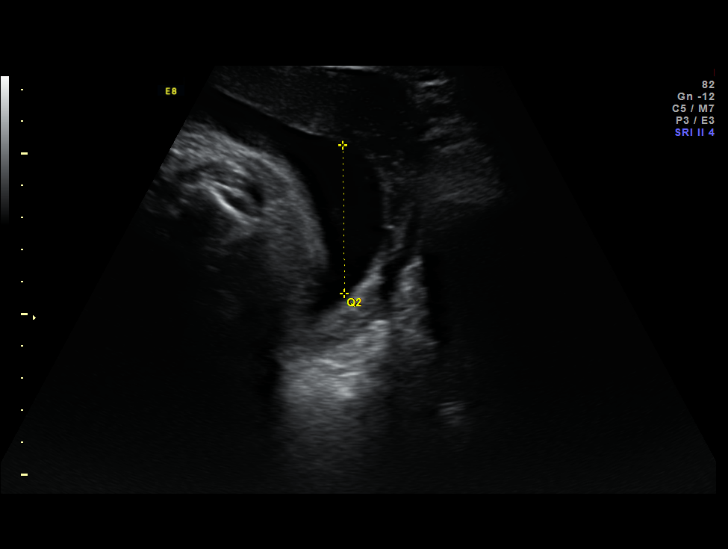
[im 11/33]
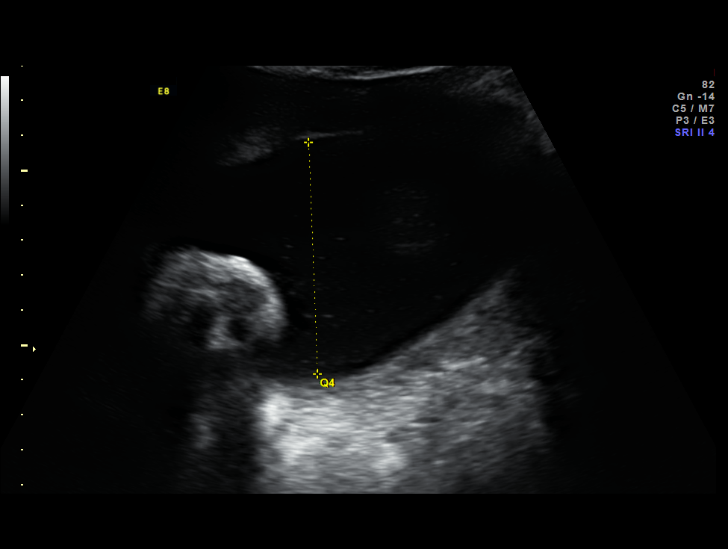
[im 14/33]
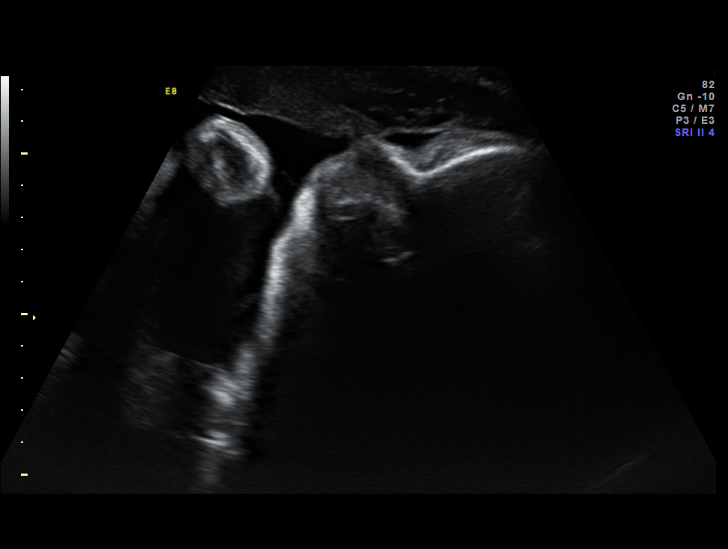
[im 16/33]
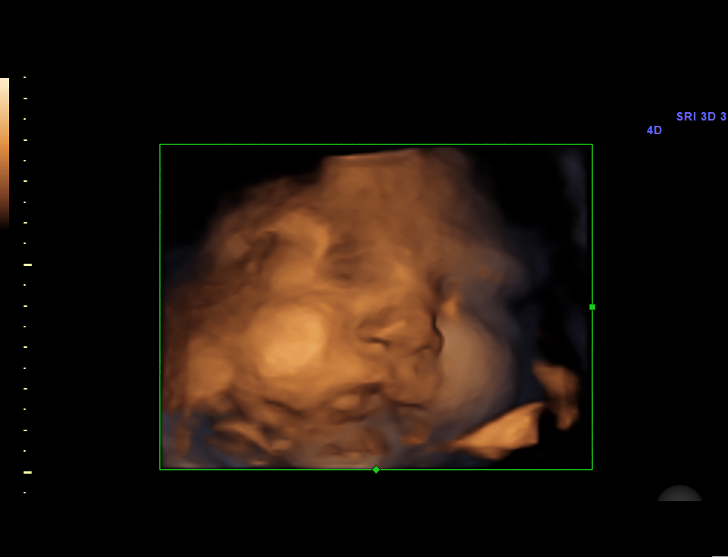
[im 18/33]
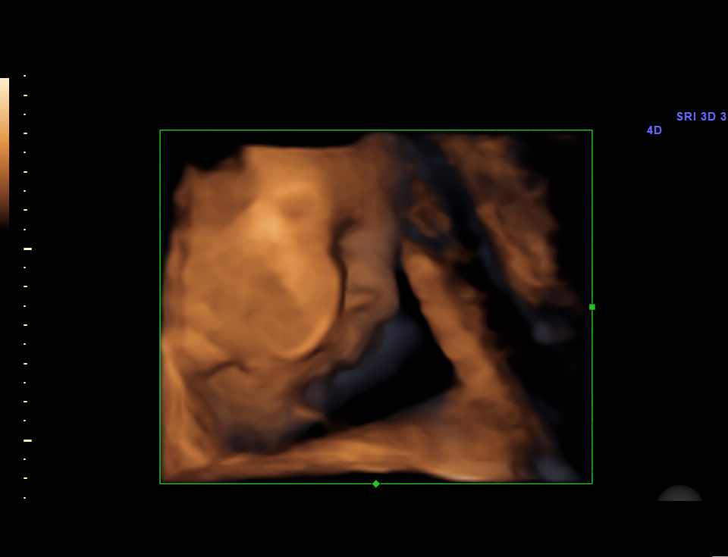
[im 21/33]
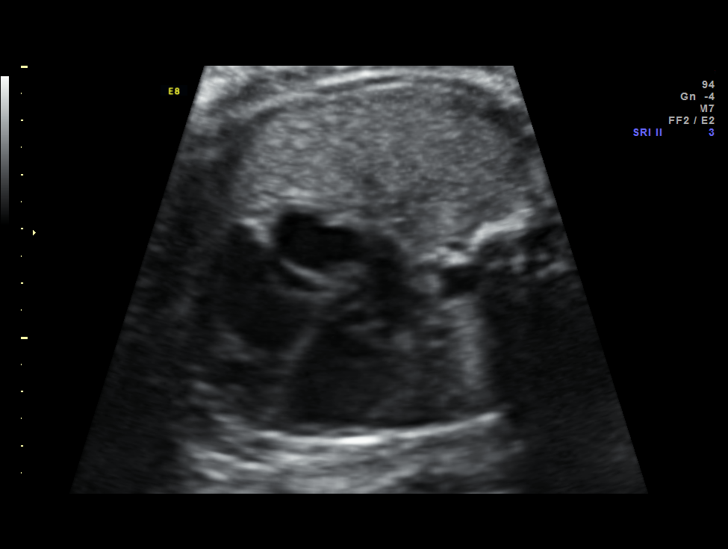
[im 23/33]
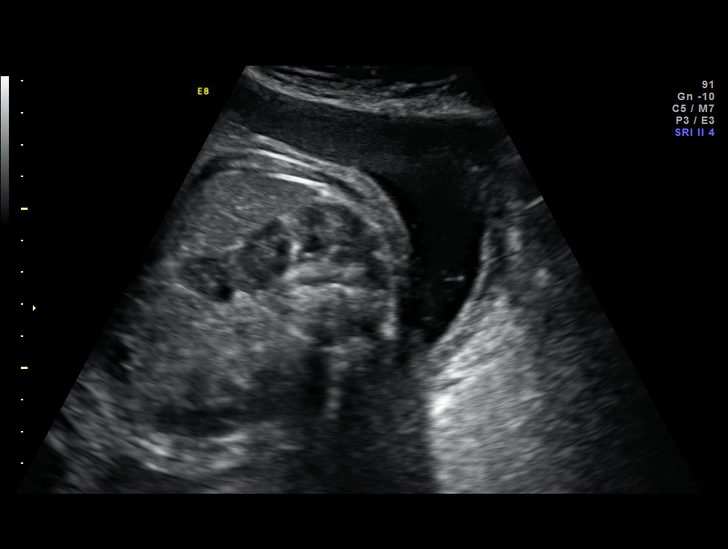
[im 25/33]
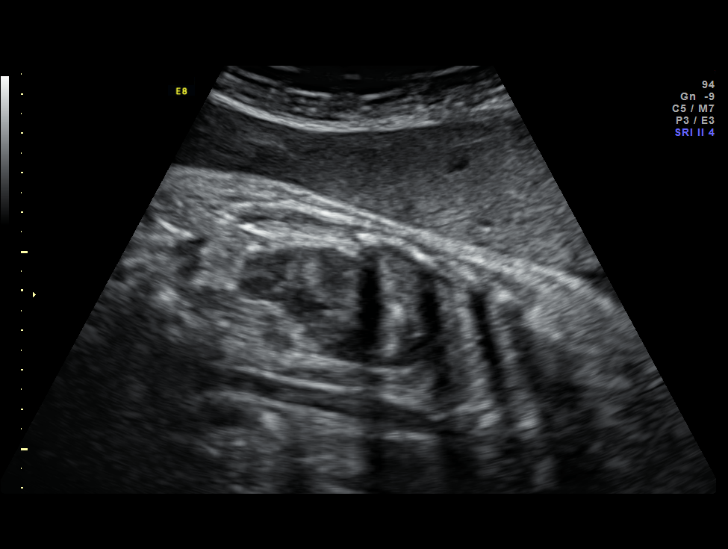
[im 28/33]
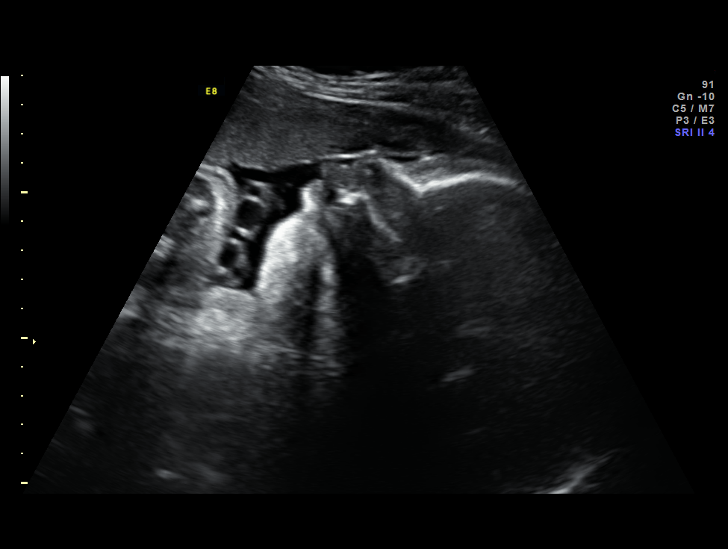
[im 30/33]
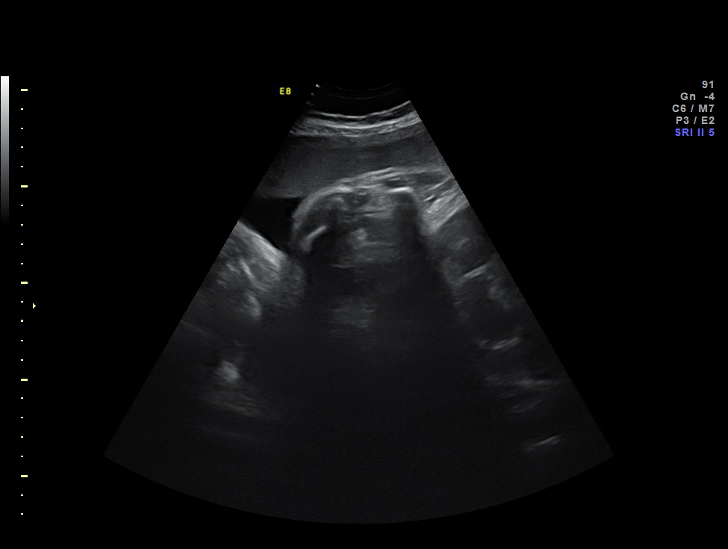
[im 33/33]
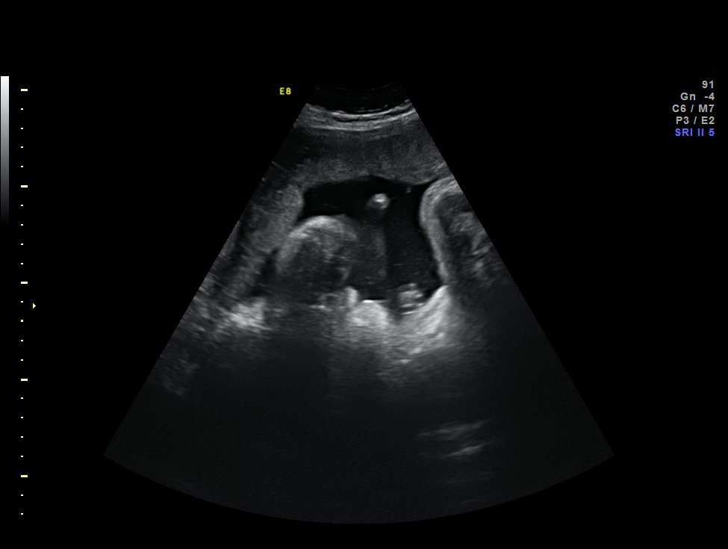

[14 of 28 positions shown; findings below may reference images not displayed]

Canned report from images found in remote index.

Refer to host system for actual result text.

## 2012-10-05 ENCOUNTER — Emergency Department (HOSPITAL_COMMUNITY)
Admission: EM | Admit: 2012-10-05 | Discharge: 2012-10-06 | Disposition: A | Discharge status: Home / Self Care | Payer: Self-pay | Attending: Emergency Medicine | Admitting: Emergency Medicine

## 2012-10-05 ENCOUNTER — Emergency Department (HOSPITAL_COMMUNITY): Payer: Self-pay

## 2012-10-05 ENCOUNTER — Encounter (HOSPITAL_COMMUNITY): Payer: Self-pay | Admitting: *Deleted

## 2012-10-05 DIAGNOSIS — M545 Low back pain, unspecified: Secondary | ICD-10-CM | POA: Insufficient documentation

## 2012-10-05 DIAGNOSIS — R1032 Left lower quadrant pain: Secondary | ICD-10-CM | POA: Insufficient documentation

## 2012-10-05 DIAGNOSIS — I880 Nonspecific mesenteric lymphadenitis: Secondary | ICD-10-CM | POA: Insufficient documentation

## 2012-10-05 DIAGNOSIS — Z3202 Encounter for pregnancy test, result negative: Secondary | ICD-10-CM | POA: Insufficient documentation

## 2012-10-05 LAB — URINE MICROSCOPIC-ADD ON

## 2012-10-05 LAB — BASIC METABOLIC PANEL
CO2: 27 mEq/L (ref 19–32)
Calcium: 9.5 mg/dL (ref 8.4–10.5)
Creatinine, Ser: 0.47 mg/dL — ABNORMAL LOW (ref 0.50–1.10)
GFR calc Af Amer: >90 mL/min (ref >90)
Sodium: 139 mEq/L (ref 135–145)

## 2012-10-05 LAB — LIPASE, BLOOD: Lipase: 34 U/L (ref 11–59)

## 2012-10-05 LAB — URINALYSIS, ROUTINE W REFLEX MICROSCOPIC
Bilirubin Urine: NEGATIVE
Glucose, UA: NEGATIVE mg/dL
Hgb urine dipstick: NEGATIVE
Ketones, ur: NEGATIVE mg/dL
Nitrite: NEGATIVE
Specific Gravity, Urine: 1.011 (ref 1.005–1.030)
pH: 6.5 (ref 5.0–8.0)

## 2012-10-05 LAB — CBC WITH DIFFERENTIAL/PLATELET
Basophils Absolute: 0 10*3/uL (ref 0.0–0.1)
Basophils Relative: 0 % (ref 0–1)
Eosinophils Relative: 3 % (ref 0–5)
Lymphocytes Relative: 19 % (ref 12–46)
MCHC: 33.9 g/dL (ref 30.0–36.0)
Neutro Abs: 7.7 10*3/uL (ref 1.7–7.7)
Platelets: 283 10*3/uL (ref 150–400)
RDW: 12.9 % (ref 11.5–15.5)
WBC: 10.9 10*3/uL — ABNORMAL HIGH (ref 4.0–10.5)

## 2012-10-05 LAB — WET PREP, GENITAL
Clue Cells Wet Prep HPF POC: NONE SEEN
Trich, Wet Prep: NONE SEEN

## 2012-10-05 LAB — POCT PREGNANCY, URINE: Preg Test, Ur: NEGATIVE

## 2012-10-05 MED ORDER — HYDROCODONE-ACETAMINOPHEN 5-325 MG PO TABS
2.0000 | ORAL_TABLET | Freq: Once | ORAL | Status: AC
  Administered 2012-10-05: 2 via ORAL
  Filled 2012-10-05: qty 2

## 2012-10-05 MED ORDER — HYDROCODONE-ACETAMINOPHEN 5-325 MG PO TABS: 2.0000 | ORAL_TABLET | ORAL | Status: DC | PRN

## 2012-10-05 MED ORDER — IOHEXOL 300 MG/ML  SOLN
80.0000 mL | Freq: Once | INTRAMUSCULAR | Status: AC | PRN
  Administered 2012-10-05: 80 mL via INTRAVENOUS

## 2012-10-05 NOTE — ED Notes (Signed)
Pt started having left lower quad pain and radiates into back that started last nite.  No NVD or constipation.  LMP--not had one since august because of iud.    No vaginal bleeding.

## 2012-10-05 NOTE — ED Notes (Signed)
Pt presents with c/c of LLQ pain that started last night, st's the pain became worse today around noon and has been getting worse since then.  No n/v/d, pt st's she had an IUD placed 10 months ago, pt st's she hasn't had this pain before.

## 2012-10-05 NOTE — ED Provider Notes (Signed)
History     CSN: [id number]  Arrival date & time 10/05/12  1611   First MD Initiated Contact with Patient 10/05/12 2029      Chief Complaint  Patient presents with  . Abdominal Pain    (Consider location/radiation/quality/duration/timing/severity/associated sxs/prior treatment) Patient is a 31 y.o. female presenting with abdominal pain. The history is provided by the patient.  Abdominal Pain The primary symptoms of the illness include abdominal pain. The primary symptoms of the illness do not include fever, shortness of breath, nausea, vomiting, diarrhea, dysuria, vaginal discharge or vaginal bleeding. The onset of the illness was gradual. Progression since onset: intermittent, worsening.  The abdominal pain began yesterday. The pain came on gradually. The abdominal pain has been gradually worsening (intermittent) since its onset. The abdominal pain is located in the LLQ. The abdominal pain radiates to the back. The severity of the abdominal pain is 4/10. The abdominal pain is relieved by nothing.  Additional symptoms associated with the illness include back pain (left lower back). Symptoms associated with the illness do not include chills, urgency or hematuria.    History reviewed. No pertinent past medical history.  History reviewed. No pertinent past surgical history.  No family history on file.  History  Substance Use Topics  . Smoking status: Never Smoker   . Smokeless tobacco: Never Used  . Alcohol Use: No    OB History    Grav Para Term Preterm Abortions TAB SAB Ect Mult Living   3 2 2  0 1 0 1 0 0 2      Review of Systems  Constitutional: Negative for fever and chills.  Respiratory: Negative for cough and shortness of breath.   Gastrointestinal: Positive for abdominal pain. Negative for nausea, vomiting and diarrhea.  Genitourinary: Negative for dysuria, urgency, hematuria, vaginal bleeding and vaginal discharge.  Musculoskeletal: Positive for back pain (left  lower back).  All other systems reviewed and are negative.    Allergies  Review of patient's allergies indicates no known allergies.  Home Medications  No current outpatient prescriptions on file.  BP 128/82  Pulse 79  Temp 99.2 F (37.3 C) (Oral)  Resp 18  SpO2 100%  Breastfeeding? Unknown  Physical Exam  Nursing note and vitals reviewed. Constitutional: She is oriented to person, place, and time. She appears well-developed and well-nourished. No distress.  HENT:  Head: Normocephalic and atraumatic.  Eyes: EOM are normal. Pupils are equal, round, and reactive to light.  Neck: Normal range of motion. Neck supple.  Cardiovascular: Normal rate and regular rhythm.  Exam reveals no friction rub.   No murmur heard. Pulmonary/Chest: Effort normal and breath sounds normal. No respiratory distress. She has no wheezes. She has no rales.  Abdominal: Soft. She exhibits no distension. There is tenderness (LLQ, suprapubic). There is no rebound.  Musculoskeletal: Normal range of motion. She exhibits no edema.  Neurological: She is alert and oriented to person, place, and time.  Skin: She is not diaphoretic.    ED Course  Procedures (including critical care time)  Labs Reviewed  CBC WITH DIFFERENTIAL - Abnormal; Notable for the following:    WBC 10.9 (*)     All other components within normal limits  BASIC METABOLIC PANEL - Abnormal; Notable for the following:    Glucose, Bld 112 (*)     Creatinine, Ser 0.47 (*)     All other components within normal limits  URINALYSIS, ROUTINE W REFLEX MICROSCOPIC - Abnormal; Notable for the following:  Leukocytes, UA SMALL (*)     All other components within normal limits  URINE MICROSCOPIC-ADD ON - Abnormal; Notable for the following:    Squamous Epithelial / LPF FEW (*)     All other components within normal limits  LIPASE, BLOOD  POCT PREGNANCY, URINE   No results found.   1. LLQ pain   2. Mesenteric adenitis       MDM   18F  with no prior history presents with LLQ abdominal pain. Started last night, worse today. Denies fevers, vomiting, diarrhea, constipation, dysuria. AFVSS here.  On exam, LLQ and suprapubic tenderness. Vaginal exam with mild cervical tenderness, cervix normal on visual inspection. No L adenxal tenderness. Mild R adnexal tenderness, no gross fullness or tenderness.  Labs with normal urine, no blood. Mild elevation in white count. Will scan abdomen to look for diverticulitis. CT Abdomen with mesenteric adenitis. Will give pain meds and return precautions. No need for antibiotics at this time.        Elwin Mocha, MD 10/05/12 [phone]

## 2012-10-06 LAB — GC/CHLAMYDIA PROBE AMP: GC Probe RNA: NEGATIVE

## 2012-10-10 NOTE — ED Provider Notes (Signed)
I saw and evaluated the patient, reviewed the resident's note and I agree with the findings and plan.   Danni Leabo, MD 10/10/12 0729 

## 2012-12-05 ENCOUNTER — Inpatient Hospital Stay (HOSPITAL_COMMUNITY)
Admission: AD | Admit: 2012-12-05 | Discharge: 2012-12-05 | Disposition: A | Discharge status: Home / Self Care | Payer: Managed Care, Other (non HMO) | Source: Ambulatory Visit | Attending: Obstetrics & Gynecology | Admitting: Obstetrics & Gynecology

## 2012-12-05 ENCOUNTER — Encounter (HOSPITAL_COMMUNITY): Payer: Self-pay

## 2012-12-05 DIAGNOSIS — N751 Abscess of Bartholin's gland: Secondary | ICD-10-CM | POA: Insufficient documentation

## 2012-12-05 HISTORY — DX: Cyst of Bartholin's gland: N75.0

## 2012-12-05 HISTORY — DX: Gestational (pregnancy-induced) hypertension without significant proteinuria, unspecified trimester: O13.9

## 2012-12-05 MED ORDER — KETOROLAC TROMETHAMINE 60 MG/2ML IM SOLN
60.0000 mg | Freq: Once | INTRAMUSCULAR | Status: AC
  Administered 2012-12-05: 60 mg via INTRAMUSCULAR
  Filled 2012-12-05: qty 2

## 2012-12-05 MED ORDER — IBUPROFEN 800 MG PO TABS: 800.0000 mg | ORAL_TABLET | Freq: Three times a day (TID) | ORAL | Status: DC

## 2012-12-05 MED ORDER — LIDOCAINE HCL 2 % EX GEL
Freq: Once | CUTANEOUS | Status: AC
  Administered 2012-12-05: 1 via TOPICAL
  Filled 2012-12-05: qty 20

## 2012-12-05 NOTE — MAU Provider Note (Signed)
History     CSN: [id number]  Arrival date and time: 12/05/12 1049   None     Chief Complaint  Patient presents with  . Abscess   HPI TARRI GUILFOIL is a 32 y.o. female who presents to MAU with a skin problem. The problem started 4 days ago. This is a recurrent problem. She has had the same area I&D in the past.  She describes the problem as a raised, red, tender area that is located on the right labia. She has been sitting in warm tubs of water to try and help the pain.  She rates the pain as 8/10. The history was provided by the patient.  OB History   Grav Para Term Preterm Abortions TAB SAB Ect Mult Living   3 2 2  0 1 0 1 0 0 2      Past Medical History  Diagnosis Date  . Cyst of Bartholin gland or duct   . Pregnancy induced hypertension     Past Surgical History  Procedure Laterality Date  . No past surgeries      History reviewed. No pertinent family history.  History  Substance Use Topics  . Smoking status: Never Smoker   . Smokeless tobacco: Never Used  . Alcohol Use: No    Allergies: No Known Allergies  No prescriptions prior to admission    Review of Systems  Constitutional: Negative for fever, chills and weight loss.  HENT: Negative for ear pain, nosebleeds, congestion, sore throat and neck pain.   Eyes: Negative for blurred vision, double vision, photophobia and pain.  Respiratory: Negative for cough, shortness of breath and wheezing.   Cardiovascular: Negative for chest pain, palpitations and leg swelling.  Gastrointestinal: Negative for heartburn, nausea, vomiting, abdominal pain, diarrhea and constipation.  Genitourinary: Negative for dysuria, urgency and frequency.       Abscess right labia  Musculoskeletal: Negative for myalgias and back pain.  Skin: Positive for rash (abscess). Negative for itching.  Neurological: Negative for dizziness, sensory change, speech change, seizures, weakness and headaches.  Endo/Heme/Allergies: Does not  bruise/bleed easily.  Psychiatric/Behavioral: Negative for depression. The patient is not nervous/anxious.    Blood pressure 122/78, pulse 91, temperature 98.3 F (36.8 C), temperature source Oral, resp. rate 16, height 5\' 5"  (1.651 m), weight 199 lb 3.2 oz (90.357 kg), last menstrual period 10/28/2012, SpO2 99.00%, unknown if currently breastfeeding.  Physical Exam  Nursing note and vitals reviewed. Constitutional: She is oriented to person, place, and time. She appears well-developed and well-nourished. No distress.  HENT:  Head: Normocephalic and atraumatic.  Eyes: EOM are normal.  Neck: Neck supple.  Cardiovascular: Normal rate.   Respiratory: Effort normal.  Genitourinary:  External genitalia with swelling, tenderness and erythema of mucous membrane of right labia starting in the superior aspect and extending to the Bartholin's gland.   Musculoskeletal: Normal range of motion.  Neurological: She is alert and oriented to person, place, and time.  Skin:  Abscess   Psychiatric: She has a normal mood and affect. Her behavior is normal. Judgment and thought content normal.   Assessment: 32 y.o. female with Bartholin's abscess  Plan:  I&D Procedures  Consent, verbal. Time out.   Patient positioned and draped with sterile towels.  Preoperative medication: Lidocaine gel applied to the area. Toradol 60 mg IM    Area cleaned with betadine Local infiltrate with lidocaine 1%. Amount 2 ccs  I&D Scalpel size: #11blade Incision type: Straight single Complexity: Complex Drained moderate amount  of purulent drainage Probed with curved hemostat to break up loculations Irrigated with NSS  Wound treatment: Word Catheter placed and inflated with 3 cc. Of NS  Patient tolerance: Tolerated procedure well without any immediate complications   Patient to return in 2 weeks for removal of Word Catheter Sitz baths, return sooner if problems. Rx Ibuprofen 800 mg tid  Discussed with the  patient and all questioned fully answered.   Medication List    TAKE these medications       ibuprofen 800 MG tablet  Commonly known as:  ADVIL,MOTRIN  Take 1 tablet (800 mg total) by mouth 3 (three) times daily.          Moria Brophy, RN, FNP,BC 12/05/2012, 2:07 PM

## 2012-12-05 NOTE — MAU Note (Signed)
No adverse reaction to toradol or lidocaine jelly

## 2012-12-05 NOTE — MAU Note (Signed)
Patient states she has an abscess on the right labia since 2-6. Getting bigger and more painful.

## 2012-12-05 NOTE — MAU Note (Signed)
Pt states pain began Thursday on r middle of her labia, hx bartholin's in past.

## 2012-12-05 NOTE — MAU Note (Signed)
Pt to return in 2 weeks for catheter removal

## 2012-12-16 NOTE — MAU Provider Note (Signed)
Attestation of Attending Supervision of Advanced Practitioner (CNM/NP): Evaluation and management procedures were performed by the Advanced Practitioner under my supervision and collaboration. I have reviewed the Advanced Practitioner's note and chart, and I agree with the management and plan.  Imonie Tuch H. 12:02 AM   

## 2012-12-19 ENCOUNTER — Encounter (HOSPITAL_COMMUNITY): Payer: Self-pay

## 2012-12-19 ENCOUNTER — Inpatient Hospital Stay (HOSPITAL_COMMUNITY)
Admission: AD | Admit: 2012-12-19 | Discharge: 2012-12-19 | Disposition: A | Discharge status: Home / Self Care | Payer: Managed Care, Other (non HMO) | Source: Ambulatory Visit | Attending: Obstetrics & Gynecology | Admitting: Obstetrics & Gynecology

## 2012-12-19 DIAGNOSIS — N751 Abscess of Bartholin's gland: Secondary | ICD-10-CM | POA: Diagnosis present

## 2012-12-19 DIAGNOSIS — Z4682 Encounter for fitting and adjustment of non-vascular catheter: Secondary | ICD-10-CM | POA: Insufficient documentation

## 2012-12-19 NOTE — MAU Provider Note (Signed)
CC: Ward catheter removal     First Provider Initiated Contact with Patient 12/19/12 1002      HPI Anita Harrison is a 32 y.o. [id number] Scheduled to have Ward catheter removed today. She had a right Bartholin's incised and drained on 12/05/2012 and the pain is improved, she's not sure if it's still draining.   Denies abnormal bleeding  Past Medical History  Diagnosis Date  . Cyst of Bartholin gland or duct   . Pregnancy induced hypertension     OB History   Grav Para Term Preterm Abortions TAB SAB Ect Mult Living   3 2 2  0 1 0 1 0 0 2     # Outc Date GA Lbr Len/2nd Wgt Sex Del Anes PTL Lv   1 TRM 11/12 [redacted]w[redacted]d 09:36 / 01:55 3.975kg(8lb12.2oz) M SVD EPI  Yes   Comments: WNL   2 TRM            3 SAB               Past Surgical History  Procedure Laterality Date  . No past surgeries      History   Social History  . Marital Status: Single    Spouse Name: N/A    Number of Children: N/A  . Years of Education: N/A   Occupational History  . Not on file.   Social History Main Topics  . Smoking status: Never Smoker   . Smokeless tobacco: Never Used  . Alcohol Use: No  . Drug Use: No  . Sexually Active: Yes    Birth Control/ Protection: None   Other Topics Concern  . Not on file   Social History Narrative  . No narrative on file    No current facility-administered medications on file prior to encounter.   No current outpatient prescriptions on file prior to encounter.    No Known Allergies  ROS Pertinent items in HPI  PHYSICAL EXAM Filed Vitals:   12/19/12 1003  BP: 116/79  Pulse: 87  Temp: 97.7 F (36.5 C)  Resp: 16   General: Well nourished, well developed female in no acute distress Cardiovascular: Normal rate Respiratory: Normal effort Abdomen: Soft, nontender Back: No CVAT Extremities: No edema Neurologic: Alert and oriented Ext Genitalia: Ward catheter in place. Abscess Firm and 2-2.5 cm in size; draining from apparent sinus small  amount of purulent discharge.   ASSESSMENT  1. Bartholin's gland abscess   Still draining  PLAN Discharge home. See AVS for patient education.    Medication List    TAKE these medications       PRESCRIPTION MEDICATION  Take 1 tablet by mouth daily. Pt started oral contraceptive yesterday, unknown name/strength       Follow-up Information   Follow up with [facility]. (Someone from GYN clinic will call you with an appointment)    Contact information:   [phone]        Danae Orleans, CNM 12/19/2012 10:09 AM

## 2012-12-19 NOTE — Discharge Instructions (Signed)
Bartholin's Cyst or Abscess  Bartholin's glands are small glands located within the folds of skin (labia) along the sides of the lower opening of the vagina (birth canal). A cyst may develop when the duct of the gland becomes blocked. When this happens, fluid that accumulates within the cyst can become infected. This is known as an abscess. The Bartholin gland produces a mucous fluid to lubricate the outside of the vagina during sexual intercourse.  SYMPTOMS    Patients with a small cyst may not have any symptoms.   Mild discomfort to severe pain depending on the size of the cyst and if it is infected (abscess).   Pain, redness, and swelling around the lower opening of the vagina.   Painful intercourse.   Pressure in the perineal area.   Swelling of the lips of the vagina (labia).   The cyst or abscess can be on one side or both sides of the vagina.  DIAGNOSIS    A large swelling is seen in the lower vagina area by your caregiver.   Painful to touch.   Redness and pain, if it is an abscess.  TREATMENT    Sometimes the cyst will go away on its own.   Apply warm wet compresses to the area or take hot sitz baths several times a day.   An incision to drain the cyst or abscess with local anesthesia.   Culture the pus, if it is an abscess.   Antibiotic treatment, if it is an abscess.   Cut open the gland and suture the edges to make the opening of the gland bigger (marsupialization).   Remove the whole gland if the cyst or abscess returns.  PREVENTION    Practice good hygiene.   Clean the vaginal area with a mild soap and soft cloth when bathing.   Do not rub hard in the vaginal area when bathing.   Protect the crotch area with a padded cushion if you take long bike rides or ride horses.   Be sure you are well lubricated when you have sexual intercourse.  HOME CARE INSTRUCTIONS    If your cyst or abscess was opened, a small piece of gauze, or a drain, may have been placed in the wound to allow  drainage. Do not remove this gauze or drain unless directed by your caregiver.   Wear feminine pads, not tampons, as needed for any drainage or bleeding.   If antibiotics were prescribed, take them exactly as directed. Finish the entire course.   Only take over-the-counter or prescription medicines for pain, discomfort, or fever as directed by your caregiver.  SEEK IMMEDIATE MEDICAL CARE IF:    You have an increase in pain, redness, swelling, or drainage.   You have bleeding from the wound which results in the use of more than the number of pads suggested by your caregiver in 24 hours.   You have chills.   You have a fever.   You develop any new problems (symptoms) or aggravation of your existing condition.  MAKE SURE YOU:    Understand these instructions.   Will watch your condition.   Will get help right away if you are not doing well or get worse.  Document Released: 10/12/2005 Document Revised: 01/04/2012 Document Reviewed: 05/30/2008  ExitCare Patient Information 2013 [employer].

## 2012-12-19 NOTE — MAU Note (Signed)
Pt here for catheter removal only. Area feeling much better.

## 2012-12-20 ENCOUNTER — Encounter: Payer: Self-pay | Admitting: Obstetrics & Gynecology

## 2013-01-02 ENCOUNTER — Ambulatory Visit (INDEPENDENT_AMBULATORY_CARE_PROVIDER_SITE_OTHER): Payer: Managed Care, Other (non HMO) | Admitting: Obstetrics & Gynecology

## 2013-01-02 ENCOUNTER — Encounter: Payer: Self-pay | Admitting: Obstetrics & Gynecology

## 2013-01-02 VITALS — BP 126/77 | HR 89 | Temp 97.2°F | Ht 63.5 in | Wt 196.1 lb

## 2013-01-02 DIAGNOSIS — N751 Abscess of Bartholin's gland: Secondary | ICD-10-CM

## 2013-01-02 NOTE — Progress Notes (Signed)
  Subjective:    Patient ID: Anita Harrison, female    DOB: August 16, 1981, 32 y.o.   MRN: [id number]  HPIbartholin's abscess drained 2/10, here to have cath removed. No drainage, some pain    Review of Systems No discharge or bleeding    Objective:   Physical Exam Filed Vitals:   01/02/13 1354  BP: 126/77  Pulse: 89  Temp: 97.2 F (36.2 C)  TempSrc: Oral  Height: 5' 3.5" (1.613 m)  Weight: 196 lb 1.6 oz (88.95 kg)  NAD, pleasant Right vulvar abscess site no swelling, not tender. Word catheter in situ, cut and removed        Assessment & Plan:  4 weeks after vulvar abscess I&D, doing well  Report if Sx return , o/w routine care    ARNOLD,JAMES 01/02/2013 2:19 PM

## 2013-01-02 NOTE — Patient Instructions (Signed)
Bartholin's Cyst and Abscess  Bartholin's glands produce mucus through small openings just outside the opening of the vagina. The mucus helps with lubrication around the vagina during sexual intercourse. If the duct becomes clogged, the gland will swell and cause a bulge on the inside of the vagina. If this becomes big enough, it can be seen and felt on the outside of the vagina as well. Sometimes, the swelling will shrink away by itself. However, if the cyst becomes infected, the Bartholin's cyst fills with pus and becomes more swollen, red and painful and becomes a Bartholin's abscess. This usually requires antibiotic treatment and surgical drainage. Sometimes, with minor surgery under local anesthesia, a small tube is placed in the cyst or abscess wall. This allows continued drainage for up to 6 weeks. Minor surgery can make a new opening to replace the clogged duct and help prevent future cysts or abscess.  If the abscess occurs several times, a minor operation with local anesthesia is necessary to remove the Bartholin's gland completely or to make it drain better. Cutting open the gland and suturing the edges to make the opening of the gland bigger (marsupialization) may be needed and should usually be done by your obstetrician-gyncology physician. Antibiotics are usually prescribed for this condition. Take all antibiotics as prescribed. Make sure to finish them even if you are doing better. Take warm sitz baths for 20 minutes, 3 times a day. See your caregiver for follow-up care as recommended.  SEEK MEDICAL CARE IF:   · You have increasing pain, swelling, or redness near the vagina.  · You have vomiting or inability to tolerate medicines.  · You have a fever.  · You have uncontrolled bleeding from the vagina.  Document Released: 11/19/2004 Document Revised: 01/04/2012 Document Reviewed: 11/22/2009  ExitCare® Patient Information ©2013 [employer].

## 2013-05-14 DIAGNOSIS — L509 Urticaria, unspecified: Secondary | ICD-10-CM | POA: Insufficient documentation

## 2013-10-03 LAB — OB RESULTS CONSOLE GC/CHLAMYDIA
Chlamydia: NEGATIVE
Gonorrhea: NEGATIVE

## 2013-10-11 LAB — OB RESULTS CONSOLE ABO/RH: RH TYPE: POSITIVE

## 2013-10-11 LAB — OB RESULTS CONSOLE RUBELLA ANTIBODY, IGM: Rubella: IMMUNE

## 2013-10-11 LAB — OB RESULTS CONSOLE HIV ANTIBODY (ROUTINE TESTING): HIV: NONREACTIVE

## 2013-10-11 LAB — OB RESULTS CONSOLE ANTIBODY SCREEN: ANTIBODY SCREEN: NEGATIVE

## 2013-10-11 LAB — OB RESULTS CONSOLE HEPATITIS B SURFACE ANTIGEN: Hepatitis B Surface Ag: NEGATIVE

## 2013-10-11 LAB — OB RESULTS CONSOLE RPR: RPR: NONREACTIVE

## 2013-10-26 NOTE — L&D Delivery Note (Signed)
Operative Delivery Note At 10:31 AM a viable and healthy female was delivered via Vaginal, Spontaneous Delivery.  Presentation: vertex; Position: Occiput,, Anterior;  Delivery of the head: 04/30/2014 10:30 AM The anterior should was not easily delivered with gentle downward traction, and a shoulder dystocia was called, nursing staff came into the room and The following maneuvers were performed:  , Suprapubic Pressure McRoberts The shoulder was then dislodged, and the rest of the body delivered without difficulty, The infant was placed on the maternal abdomen and noted to have Vigorous cry moving all four extremities.   Pediatricians called into evaluate shoulder stated "looked fine".    APGAR: 8, 9; weight 9 lb 4.5 oz (4210 g).   Placenta status: Intact, Spontaneous.   Cord: 3 vessels with the following complications: None.   Anesthesia: Epidural  Episiotomy: None Lacerations: None Suture Repair: none Est. Blood Loss (mL): 400  Mom to postpartum.  Baby to Couplet care / Skin to Skin.  Anita HartINN, Anita Harrison 04/30/2014, 1:15 PM

## 2014-03-20 ENCOUNTER — Encounter (HOSPITAL_COMMUNITY): Payer: Self-pay

## 2014-03-20 ENCOUNTER — Inpatient Hospital Stay (HOSPITAL_COMMUNITY)
Admission: AD | Admit: 2014-03-20 | Discharge: 2014-03-20 | Disposition: A | Discharge status: Home / Self Care | Payer: Managed Care, Other (non HMO) | Source: Ambulatory Visit | Attending: Obstetrics and Gynecology | Admitting: Obstetrics and Gynecology

## 2014-03-20 DIAGNOSIS — O26899 Other specified pregnancy related conditions, unspecified trimester: Secondary | ICD-10-CM

## 2014-03-20 DIAGNOSIS — O99891 Other specified diseases and conditions complicating pregnancy: Secondary | ICD-10-CM | POA: Insufficient documentation

## 2014-03-20 DIAGNOSIS — O4700 False labor before 37 completed weeks of gestation, unspecified trimester: Secondary | ICD-10-CM | POA: Insufficient documentation

## 2014-03-20 DIAGNOSIS — R109 Unspecified abdominal pain: Secondary | ICD-10-CM

## 2014-03-20 DIAGNOSIS — O9989 Other specified diseases and conditions complicating pregnancy, childbirth and the puerperium: Secondary | ICD-10-CM

## 2014-03-20 LAB — URINALYSIS, ROUTINE W REFLEX MICROSCOPIC
Bilirubin Urine: NEGATIVE
GLUCOSE, UA: NEGATIVE mg/dL
Hgb urine dipstick: NEGATIVE
KETONES UR: NEGATIVE mg/dL
LEUKOCYTES UA: NEGATIVE
Nitrite: NEGATIVE
PROTEIN: NEGATIVE mg/dL
Specific Gravity, Urine: 1.025 (ref 1.005–1.030)
Urobilinogen, UA: 0.2 mg/dL (ref 0.0–1.0)
pH: 6 (ref 5.0–8.0)

## 2014-03-20 NOTE — MAU Note (Signed)
Patient states she started having low back pain and left lower abdominal pain last night, worse with movement and was worse last night. Denies bleeding or leaking and reports good fetal movement.

## 2014-03-20 NOTE — Discharge Instructions (Signed)
Abdominal Pain During Pregnancy °Abdominal pain is common in pregnancy. Most of the time, it does not cause harm. There are many causes of abdominal pain. Some causes are more serious than others. Some of the causes of abdominal pain in pregnancy are easily diagnosed. Occasionally, the diagnosis takes time to understand. Other times, the cause is not determined. Abdominal pain can be a sign that something is very wrong with the pregnancy, or the pain may have nothing to do with the pregnancy at all. For this reason, always tell your health care provider if you have any abdominal discomfort. °HOME CARE INSTRUCTIONS  °Monitor your abdominal pain for any changes. The following actions may help to alleviate any discomfort you are experiencing: °· Do not have sexual intercourse or put anything in your vagina until your symptoms go away completely. °· Get plenty of rest until your pain improves. °· Drink clear fluids if you feel nauseous. Avoid solid food as long as you are uncomfortable or nauseous. °· Only take over-the-counter or prescription medicine as directed by your health care provider. °· Keep all follow-up appointments with your health care provider. °SEEK IMMEDIATE MEDICAL CARE IF: °· You are bleeding, leaking fluid, or passing tissue from the vagina. °· You have increasing pain or cramping. °· You have persistent vomiting. °· You have painful or bloody urination. °· You have a fever. °· You notice a decrease in your baby's movements. °· You have extreme weakness or feel faint. °· You have shortness of breath, with or without abdominal pain. °· You develop a severe headache with abdominal pain. °· You have abnormal vaginal discharge with abdominal pain. °· You have persistent diarrhea. °· You have abdominal pain that continues even after rest, or gets worse. °MAKE SURE YOU:  °· Understand these instructions. °· Will watch your condition. °· Will get help right away if you are not doing well or get  worse. °Document Released: 10/12/2005 Document Revised: 08/02/2013 Document Reviewed: 05/11/2013 °ExitCare® Patient Information ©2014 [employer]. ° °

## 2014-03-20 NOTE — MAU Provider Note (Signed)
History     CSN: [id number]  Arrival date and time: 03/20/14 1258   First Provider Initiated Contact with Patient 03/20/14 1459      Chief Complaint  Patient presents with  . Abdominal Pain  . Back Pain   HPI  Ms. Anita Harrison is a 33 y.o. female [phone] at [redacted]w[redacted]d who presents with left sided abdominal pain and back pain. Symptoms started yesterday and the symptoms come and go. She reports good fetal movement, denies LOF, vaginal bleeding, vaginal itching/burning, urinary symptoms, h/a, dizziness, n/v, or fever/chills.   She took tylenol this morning around 1030; without relief.   OB History   Grav Para Term Preterm Abortions TAB SAB Ect Mult Living   4 2 2  0 1 0 1 0 0 2      Past Medical History  Diagnosis Date  . Cyst of Bartholin gland or duct   . Pregnancy induced hypertension     Past Surgical History  Procedure Laterality Date  . No past surgeries      History reviewed. No pertinent family history.  History  Substance Use Topics  . Smoking status: Never Smoker   . Smokeless tobacco: Never Used  . Alcohol Use: No    Allergies: No Known Allergies  Prescriptions prior to admission  Medication Sig Dispense Refill  . acetaminophen (TYLENOL) 500 MG tablet Take 1,000 mg by mouth every 6 (six) hours as needed for moderate pain.      . Prenatal Vit-Fe Fumarate-FA (PRENATAL MULTIVITAMIN) TABS tablet Take 1 tablet by mouth daily at 12 noon.       Results for orders placed during the hospital encounter of 03/20/14 (from the past 48 hour(s))  URINALYSIS, ROUTINE W REFLEX MICROSCOPIC     Status: None   Collection Time    03/20/14  1:03 PM      Result Value Ref Range   Color, Urine YELLOW  YELLOW   APPearance CLEAR  CLEAR   Specific Gravity, Urine 1.025  1.005 - 1.030   pH 6.0  5.0 - 8.0   Glucose, UA NEGATIVE  NEGATIVE mg/dL   Hgb urine dipstick NEGATIVE  NEGATIVE   Bilirubin Urine NEGATIVE  NEGATIVE   Ketones, ur NEGATIVE  NEGATIVE mg/dL   Protein, ur  NEGATIVE  NEGATIVE mg/dL   Urobilinogen, UA 0.2  0.0 - 1.0 mg/dL   Nitrite NEGATIVE  NEGATIVE   Leukocytes, UA NEGATIVE  NEGATIVE   Comment: MICROSCOPIC NOT DONE ON URINES WITH NEGATIVE PROTEIN, BLOOD, LEUKOCYTES, NITRITE, OR GLUCOSE <1000 mg/dL.    Review of Systems  Constitutional: Negative for fever and chills.  Eyes: Negative for blurred vision.  Gastrointestinal: Positive for abdominal pain (left sided abdominal pain ). Negative for nausea, vomiting, diarrhea and constipation.  Musculoskeletal: Positive for back pain (+ middle back pain ).  Neurological: Negative for headaches.   Physical Exam   Blood pressure 116/69, pulse 80, temperature 98.6 F (37 C), temperature source Oral, resp. rate 20, height 5' 3.5" (1.613 m), weight 98.612 kg (217 lb 6.4 oz), SpO2 100.00%.  Physical Exam  Constitutional: She is oriented to person, place, and time. She appears well-developed and well-nourished. No distress.  HENT:  Head: Normocephalic.  Eyes: Pupils are equal, round, and reactive to light.  Neck: Neck supple.  Respiratory: Effort normal.  GI: Soft. Normal appearance. There is no tenderness. There is no rigidity, no rebound, no guarding and no CVA tenderness.  Musculoskeletal: Normal range of motion.  Neurological: She is alert  and oriented to person, place, and time.  Skin: Skin is warm. She is not diaphoretic.  Psychiatric: Her behavior is normal.     Fetal Tracing: Baseline: 130 bpm  Variability: Moderate  Accelerations: 15x15 Decelerations: None Toco: UI with irregular contraction pattern One contraction noted from 1430-1500   MAU Course  Procedures None  MDM Discussed patient with Dr. Tenny Crawoss  Pain appears to be associated with fetal movements on the monitor.  Pt is not feeling contractions or tightening of her abdomen at this time.  Pt had one contraction on the monitor from 1430-1500.  Assessment and Plan   A:  1. Abdominal pain in pregnancy     P:  Discharge home in stable condition Keep your follow up appt with your OB Return to MAU as needed, if symptoms worsen  Fetal kick counts discussed   Iona HansenJennifer Irene Darlisha Kelm, NP  03/22/2014, 9:30 AM

## 2014-03-20 NOTE — MAU Note (Signed)
Pt presents with intermittent back pain since last night. Pt also reports L Lower abdominal pain that comes and goes. UA obtained.

## 2014-03-27 LAB — OB RESULTS CONSOLE GBS: GBS: NEGATIVE

## 2014-04-22 ENCOUNTER — Inpatient Hospital Stay (HOSPITAL_COMMUNITY)
Admission: AD | Admit: 2014-04-22 | Discharge: 2014-04-22 | Disposition: A | Discharge status: Home / Self Care | Payer: Managed Care, Other (non HMO) | Source: Ambulatory Visit | Attending: Obstetrics and Gynecology | Admitting: Obstetrics and Gynecology

## 2014-04-22 ENCOUNTER — Encounter (HOSPITAL_COMMUNITY): Payer: Self-pay | Admitting: *Deleted

## 2014-04-22 DIAGNOSIS — O479 False labor, unspecified: Secondary | ICD-10-CM | POA: Insufficient documentation

## 2014-04-22 NOTE — MAU Note (Signed)
Pt presents to MAU with complaints of contractions throughout the day that have gotten worse. Denies any vaginal bleeding or LOF

## 2014-04-22 NOTE — Discharge Instructions (Signed)
Third Trimester of Pregnancy The third trimester is from week 29 through week 42, months 7 through 9. The third trimester is a time when the fetus is growing rapidly. At the end of the ninth month, the fetus is about 20 inches in length and weighs 6-10 pounds.  BODY CHANGES Your body goes through many changes during pregnancy. The changes vary from woman to woman.   Your weight will continue to increase. You can expect to gain 25-35 pounds (11-16 kg) by the end of the pregnancy.  You may begin to get stretch marks on your hips, abdomen, and breasts.  You may urinate more often because the fetus is moving lower into your pelvis and pressing on your bladder.  You may develop or continue to have heartburn as a result of your pregnancy.  You may develop constipation because certain hormones are causing the muscles that push waste through your intestines to slow down.  You may develop hemorrhoids or swollen, bulging veins (varicose veins).  You may have pelvic pain because of the weight gain and pregnancy hormones relaxing your joints between the bones in your pelvis. Backaches may result from overexertion of the muscles supporting your posture.  You may have changes in your hair. These can include thickening of your hair, rapid growth, and changes in texture. Some women also have hair loss during or after pregnancy, or hair that feels dry or thin. Your hair will most likely return to normal after your baby is born.  Your breasts will continue to grow and be tender. A yellow discharge may leak from your breasts called colostrum.  Your belly button may stick out.  You may feel short of breath because of your expanding uterus.  You may notice the fetus "dropping," or moving lower in your abdomen.  You may have a bloody mucus discharge. This usually occurs a few days to a week before labor begins.  Your cervix becomes thin and soft (effaced) near your due date. WHAT TO EXPECT AT YOUR PRENATAL  EXAMS  You will have prenatal exams every 2 weeks until week 36. Then, you will have weekly prenatal exams. During a routine prenatal visit:  You will be weighed to make sure you and the fetus are growing normally.  Your blood pressure is taken.  Your abdomen will be measured to track your baby's growth.  The fetal heartbeat will be listened to.  Any test results from the previous visit will be discussed.  You may have a cervical check near your due date to see if you have effaced. At around 36 weeks, your caregiver will check your cervix. At the same time, your caregiver will also perform a test on the secretions of the vaginal tissue. This test is to determine if a type of bacteria, Group B streptococcus, is present. Your caregiver will explain this further. Your caregiver may ask you:  What your birth plan is.  How you are feeling.  If you are feeling the baby move.  If you have had any abnormal symptoms, such as leaking fluid, bleeding, severe headaches, or abdominal cramping.  If you have any questions. Other tests or screenings that may be performed during your third trimester include:  Blood tests that check for low iron levels (anemia).  Fetal testing to check the health, activity level, and growth of the fetus. Testing is done if you have certain medical conditions or if there are problems during the pregnancy. FALSE LABOR You may feel small, irregular contractions that   eventually go away. These are called Braxton Hicks contractions, or false labor. Contractions may last for hours, days, or even weeks before true labor sets in. If contractions come at regular intervals, intensify, or become painful, it is best to be seen by your caregiver.  SIGNS OF LABOR   Menstrual-like cramps.  Contractions that are 5 minutes apart or less.  Contractions that start on the top of the uterus and spread down to the lower abdomen and back.  A sense of increased pelvic pressure or back  pain.  A watery or bloody mucus discharge that comes from the vagina. If you have any of these signs before the 37th week of pregnancy, call your caregiver right away. You need to go to the hospital to get checked immediately. HOME CARE INSTRUCTIONS   Avoid all smoking, herbs, alcohol, and unprescribed drugs. These chemicals affect the formation and growth of the baby.  Follow your caregiver's instructions regarding medicine use. There are medicines that are either safe or unsafe to take during pregnancy.  Exercise only as directed by your caregiver. Experiencing uterine cramps is a good sign to stop exercising.  Continue to eat regular, healthy meals.  Wear a good support bra for breast tenderness.  Do not use hot tubs, steam rooms, or saunas.  Wear your seat belt at all times when driving.  Avoid raw meat, uncooked cheese, cat litter boxes, and soil used by cats. These carry germs that can cause birth defects in the baby.  Take your prenatal vitamins.  Try taking a stool softener (if your caregiver approves) if you develop constipation. Eat more high-fiber foods, such as fresh vegetables or fruit and whole grains. Drink plenty of fluids to keep your urine clear or pale yellow.  Take warm sitz baths to soothe any pain or discomfort caused by hemorrhoids. Use hemorrhoid cream if your caregiver approves.  If you develop varicose veins, wear support hose. Elevate your feet for 15 minutes, 3-4 times a day. Limit salt in your diet.  Avoid heavy lifting, wear low heal shoes, and practice good posture.  Rest a lot with your legs elevated if you have leg cramps or low back pain.  Visit your dentist if you have not gone during your pregnancy. Use a soft toothbrush to brush your teeth and be gentle when you floss.  A sexual relationship may be continued unless your caregiver directs you otherwise.  Do not travel far distances unless it is absolutely necessary and only with the approval  of your caregiver.  Take prenatal classes to understand, practice, and ask questions about the labor and delivery.  Make a trial run to the hospital.  Pack your hospital bag.  Prepare the baby's nursery.  Continue to go to all your prenatal visits as directed by your caregiver. SEEK MEDICAL CARE IF:  You are unsure if you are in labor or if your water has broken.  You have dizziness.  You have mild pelvic cramps, pelvic pressure, or nagging pain in your abdominal area.  You have persistent nausea, vomiting, or diarrhea.  You have a bad smelling vaginal discharge.  You have pain with urination. SEEK IMMEDIATE MEDICAL CARE IF:   You have a fever.  You are leaking fluid from your vagina.  You have spotting or bleeding from your vagina.  You have severe abdominal cramping or pain.  You have rapid weight loss or gain.  You have shortness of breath with chest pain.  You notice sudden or extreme swelling   of your face, hands, ankles, feet, or legs.  You have not felt your baby move in over an hour.  You have severe headaches that do not go away with medicine.  You have vision changes. Document Released: 10/06/2001 Document Revised: 10/17/2013 Document Reviewed: 12/13/2012 Exit[employer] Patient Information 2015 [employer]. This information is not intended to replace advice given to you by your health care provider. Make sure you discuss any questions you have with your health care provider.  

## 2014-04-22 NOTE — Progress Notes (Signed)
Notified of pt's exam, cervical exam, efm tracing wnl, orders to d/c home.

## 2014-04-29 ENCOUNTER — Inpatient Hospital Stay (HOSPITAL_COMMUNITY): Payer: Managed Care, Other (non HMO) | Admitting: Anesthesiology

## 2014-04-29 ENCOUNTER — Encounter (HOSPITAL_COMMUNITY): Payer: Self-pay | Admitting: *Deleted

## 2014-04-29 ENCOUNTER — Encounter (HOSPITAL_COMMUNITY): Payer: Managed Care, Other (non HMO) | Admitting: Anesthesiology

## 2014-04-29 ENCOUNTER — Inpatient Hospital Stay (HOSPITAL_COMMUNITY)
Admission: AD | Admit: 2014-04-29 | Discharge: 2014-05-01 | DRG: 775 | Disposition: A | Discharge status: Home / Self Care | Payer: Managed Care, Other (non HMO) | Source: Ambulatory Visit | Attending: Obstetrics and Gynecology | Admitting: Obstetrics and Gynecology

## 2014-04-29 DIAGNOSIS — IMO0001 Reserved for inherently not codable concepts without codable children: Secondary | ICD-10-CM

## 2014-04-29 LAB — CBC
HCT: 37.6 % (ref 36.0–46.0)
Hemoglobin: 12.4 g/dL (ref 12.0–15.0)
MCH: 28.4 pg (ref 26.0–34.0)
MCHC: 33 g/dL (ref 30.0–36.0)
MCV: 86.2 fL (ref 78.0–100.0)
PLATELETS: 276 10*3/uL (ref 150–400)
RBC: 4.36 MIL/uL (ref 3.87–5.11)
RDW: 16 % — ABNORMAL HIGH (ref 11.5–15.5)
WBC: 14.1 10*3/uL — ABNORMAL HIGH (ref 4.0–10.5)

## 2014-04-29 MED ORDER — PHENYLEPHRINE 40 MCG/ML (10ML) SYRINGE FOR IV PUSH (FOR BLOOD PRESSURE SUPPORT)
80.0000 ug | PREFILLED_SYRINGE | INTRAVENOUS | Status: DC | PRN
  Filled 2014-04-29: qty 2

## 2014-04-29 MED ORDER — CITRIC ACID-SODIUM CITRATE 334-500 MG/5ML PO SOLN: 30.0000 mL | ORAL | Status: DC | PRN

## 2014-04-29 MED ORDER — LIDOCAINE HCL (PF) 1 % IJ SOLN
30.0000 mL | INTRAMUSCULAR | Status: DC | PRN
  Filled 2014-04-29: qty 30

## 2014-04-29 MED ORDER — PHENYLEPHRINE 40 MCG/ML (10ML) SYRINGE FOR IV PUSH (FOR BLOOD PRESSURE SUPPORT)
80.0000 ug | PREFILLED_SYRINGE | INTRAVENOUS | Status: DC | PRN
  Filled 2014-04-29: qty 10
  Filled 2014-04-29: qty 2

## 2014-04-29 MED ORDER — LIDOCAINE HCL (PF) 1 % IJ SOLN
INTRAMUSCULAR | Status: DC | PRN
  Administered 2014-04-29 (×4): 4 mL

## 2014-04-29 MED ORDER — LACTATED RINGERS IV SOLN
INTRAVENOUS | Status: DC
  Administered 2014-04-29 – 2014-04-30 (×3): via INTRAVENOUS

## 2014-04-29 MED ORDER — EPHEDRINE 5 MG/ML INJ
10.0000 mg | INTRAVENOUS | Status: DC | PRN
Start: 2014-04-29 — End: 2014-05-01
  Filled 2014-04-29: qty 2

## 2014-04-29 MED ORDER — FLEET ENEMA 7-19 GM/118ML RE ENEM: 1.0000 | ENEMA | RECTAL | Status: DC | PRN

## 2014-04-29 MED ORDER — BUTORPHANOL TARTRATE 1 MG/ML IJ SOLN: 1.0000 mg | INTRAMUSCULAR | Status: DC | PRN

## 2014-04-29 MED ORDER — IBUPROFEN 600 MG PO TABS
600.0000 mg | ORAL_TABLET | Freq: Four times a day (QID) | ORAL | Status: DC | PRN
  Administered 2014-04-30: 600 mg via ORAL
  Filled 2014-04-29: qty 1

## 2014-04-29 MED ORDER — OXYTOCIN 40 UNITS IN LACTATED RINGERS INFUSION - SIMPLE MED: 62.5000 mL/h | INTRAVENOUS | Status: DC

## 2014-04-29 MED ORDER — ONDANSETRON HCL 4 MG/2ML IJ SOLN
4.0000 mg | Freq: Four times a day (QID) | INTRAMUSCULAR | Status: DC | PRN
  Administered 2014-04-30: 4 mg via INTRAVENOUS
  Filled 2014-04-29: qty 2

## 2014-04-29 MED ORDER — LACTATED RINGERS IV SOLN
500.0000 mL | Freq: Once | INTRAVENOUS | Status: AC
  Administered 2014-04-29: 500 mL via INTRAVENOUS

## 2014-04-29 MED ORDER — EPHEDRINE 5 MG/ML INJ
10.0000 mg | INTRAVENOUS | Status: DC | PRN
  Filled 2014-04-29: qty 2

## 2014-04-29 MED ORDER — OXYTOCIN BOLUS FROM INFUSION
500.0000 mL | INTRAVENOUS | Status: DC
  Administered 2014-04-30: 500 mL via INTRAVENOUS

## 2014-04-29 MED ORDER — LACTATED RINGERS IV SOLN: 500.0000 mL | INTRAVENOUS | Status: DC | PRN

## 2014-04-29 MED ORDER — ACETAMINOPHEN 325 MG PO TABS: 650.0000 mg | ORAL_TABLET | ORAL | Status: DC | PRN

## 2014-04-29 MED ORDER — DIPHENHYDRAMINE HCL 50 MG/ML IJ SOLN: 12.5000 mg | INTRAMUSCULAR | Status: DC | PRN

## 2014-04-29 MED ORDER — OXYCODONE-ACETAMINOPHEN 5-325 MG PO TABS: 1.0000 | ORAL_TABLET | ORAL | Status: DC | PRN

## 2014-04-29 MED ORDER — FENTANYL 2.5 MCG/ML BUPIVACAINE 1/10 % EPIDURAL INFUSION (WH - ANES)
14.0000 mL/h | INTRAMUSCULAR | Status: DC | PRN
  Administered 2014-04-29 – 2014-04-30 (×3): 14 mL/h via EPIDURAL
  Filled 2014-04-29 (×2): qty 125

## 2014-04-29 NOTE — Anesthesia Preprocedure Evaluation (Signed)
Anesthesia Evaluation  Patient identified by MRN, date of birth, ID band Patient awake    Reviewed: Allergy & Precautions, H&P , NPO status , Patient's Chart, lab work & pertinent test results, reviewed documented beta blocker date and time   History of Anesthesia Complications Negative for: history of anesthetic complications  Airway Mallampati: I TM Distance: >3 FB Neck ROM: full    Dental  (+) Missing, Loose,    Pulmonary neg pulmonary ROS,  breath sounds clear to auscultation        Cardiovascular Rhythm:regular Rate:Normal     Neuro/Psych negative neurological ROS  negative psych ROS   GI/Hepatic negative GI ROS, Neg liver ROS,   Endo/Other  BMI 39  Renal/GU negative Renal ROS     Musculoskeletal   Abdominal   Peds  Hematology negative hematology ROS (+)   Anesthesia Other Findings   Reproductive/Obstetrics (+) Pregnancy                           Anesthesia Physical Anesthesia Plan  ASA: II  Anesthesia Plan: Epidural   Post-op Pain Management:    Induction:   Airway Management Planned:   Additional Equipment:   Intra-op Plan:   Post-operative Plan:   Informed Consent: I have reviewed the patients History and Physical, chart, labs and discussed the procedure including the risks, benefits and alternatives for the proposed anesthesia with the patient or authorized representative who has indicated his/her understanding and acceptance.     Plan Discussed with:   Anesthesia Plan Comments:         Anesthesia Quick Evaluation

## 2014-04-29 NOTE — Anesthesia Procedure Notes (Signed)
Epidural Patient location during procedure: OB Start time: 04/29/2014 10:52 PM  Staffing Performed by: anesthesiologist   Preanesthetic Checklist Completed: patient identified, site marked, surgical consent, pre-op evaluation, timeout performed, IV checked, risks and benefits discussed and monitors and equipment checked  Epidural Patient position: sitting Prep: site prepped and draped and DuraPrep Patient monitoring: continuous pulse ox and blood pressure Approach: midline Injection technique: LOR air  Needle:  Needle type: Tuohy  Needle gauge: 17 G Needle length: 9 cm and 9 Needle insertion depth: 5 cm cm Catheter type: closed end flexible Catheter size: 19 Gauge Catheter at skin depth: 10 cm Test dose: negative  Assessment Events: blood not aspirated, injection not painful, no injection resistance, negative IV test and no paresthesia  Additional Notes Discussed risk of headache, infection, bleeding, nerve injury and failed or incomplete block.  Patient voices understanding and wishes to proceed.  Epidural placed easily on first attempt.  No paresthesia.  Patient tolerated procedure well with no apparent complications. Jasmine DecemberA> Catrena Vari, MD Reason for block:procedure for pain

## 2014-04-29 NOTE — MAU Note (Signed)
Pt reports strong contractions since 7 pm.

## 2014-04-30 ENCOUNTER — Encounter (HOSPITAL_COMMUNITY): Payer: Self-pay | Admitting: *Deleted

## 2014-04-30 LAB — RPR

## 2014-04-30 MED ORDER — TETANUS-DIPHTH-ACELL PERTUSSIS 5-2.5-18.5 LF-MCG/0.5 IM SUSP
0.5000 mL | Freq: Once | INTRAMUSCULAR | Status: AC
  Administered 2014-05-01: 0.5 mL via INTRAMUSCULAR
  Filled 2014-04-30: qty 0.5

## 2014-04-30 MED ORDER — OXYTOCIN 40 UNITS IN LACTATED RINGERS INFUSION - SIMPLE MED
1.0000 m[IU]/min | INTRAVENOUS | Status: DC
  Administered 2014-04-30: 2 m[IU]/min via INTRAVENOUS
  Filled 2014-04-30: qty 1000

## 2014-04-30 MED ORDER — IBUPROFEN 600 MG PO TABS
600.0000 mg | ORAL_TABLET | Freq: Four times a day (QID) | ORAL | Status: DC
  Administered 2014-04-30 – 2014-05-01 (×4): 600 mg via ORAL
  Filled 2014-04-30 (×4): qty 1

## 2014-04-30 MED ORDER — PRENATAL MULTIVITAMIN CH
1.0000 | ORAL_TABLET | Freq: Every day | ORAL | Status: DC
  Administered 2014-05-01: 1 via ORAL
  Filled 2014-04-30: qty 1

## 2014-04-30 MED ORDER — ZOLPIDEM TARTRATE 5 MG PO TABS: 5.0000 mg | ORAL_TABLET | Freq: Every evening | ORAL | Status: DC | PRN

## 2014-04-30 MED ORDER — SIMETHICONE 80 MG PO CHEW: 80.0000 mg | CHEWABLE_TABLET | ORAL | Status: DC | PRN

## 2014-04-30 MED ORDER — DIBUCAINE 1 % RE OINT: 1.0000 "application " | TOPICAL_OINTMENT | RECTAL | Status: DC | PRN

## 2014-04-30 MED ORDER — DIPHENHYDRAMINE HCL 25 MG PO CAPS: 25.0000 mg | ORAL_CAPSULE | Freq: Four times a day (QID) | ORAL | Status: DC | PRN

## 2014-04-30 MED ORDER — OXYCODONE-ACETAMINOPHEN 5-325 MG PO TABS
1.0000 | ORAL_TABLET | ORAL | Status: DC | PRN
  Administered 2014-04-30: 2 via ORAL
  Filled 2014-04-30: qty 2

## 2014-04-30 MED ORDER — WITCH HAZEL-GLYCERIN EX PADS: 1.0000 "application " | MEDICATED_PAD | CUTANEOUS | Status: DC | PRN

## 2014-04-30 MED ORDER — ONDANSETRON HCL 4 MG/2ML IJ SOLN: 4.0000 mg | INTRAMUSCULAR | Status: DC | PRN

## 2014-04-30 MED ORDER — SENNOSIDES-DOCUSATE SODIUM 8.6-50 MG PO TABS
2.0000 | ORAL_TABLET | ORAL | Status: DC
  Administered 2014-05-01: 2 via ORAL
  Filled 2014-04-30: qty 2

## 2014-04-30 MED ORDER — LANOLIN HYDROUS EX OINT: TOPICAL_OINTMENT | CUTANEOUS | Status: DC | PRN

## 2014-04-30 MED ORDER — TERBUTALINE SULFATE 1 MG/ML IJ SOLN
0.2500 mg | Freq: Once | INTRAMUSCULAR | Status: AC | PRN
Start: 2014-04-30 — End: 2014-04-30

## 2014-04-30 MED ORDER — ONDANSETRON HCL 4 MG PO TABS: 4.0000 mg | ORAL_TABLET | ORAL | Status: DC | PRN

## 2014-04-30 MED ORDER — BENZOCAINE-MENTHOL 20-0.5 % EX AERO: 1.0000 "application " | INHALATION_SPRAY | CUTANEOUS | Status: DC | PRN

## 2014-04-30 NOTE — Progress Notes (Signed)
Neonatologist came after delivery to evaluate left shoulder

## 2014-04-30 NOTE — Progress Notes (Signed)
Anita MorasYaira O Harrison is a 33 y.o. [id number] at 7151w1d by LMP admitted for active labor  Subjective: Patient comfortable with epidural  Objective: BP 127/75  Pulse 70  Temp(Src) 97.5 F (36.4 C) (Oral)  Resp 16  Ht 5\' 4"  (1.626 m)  Wt 102.513 kg (226 lb)  BMI 38.77 kg/m2  SpO2 100% I/O last 3 completed shifts: In: -  Out: 600 [Urine:600]    FHT:  FHR: 135 bpm, variability: moderate,  accelerations:  Present,  decelerations:  Absent UC:   regular, every 2-5 minutes SVE:   Dilation: 6 Effacement (%): 80 Station: 0 Exam by:: Dr Mora ApplPinn  Labs: Lab Results  Component Value Date   WBC 14.1* 04/29/2014   HGB 12.4 04/29/2014   HCT 37.6 04/29/2014   MCV 86.2 04/29/2014   PLT 276 04/29/2014    Assessment / Plan: Protracted latent phase  Labor: Progressing normally s/p AROM clear fluid, will start pitocin Preeclampsia:  intake and ouput balanced Fetal Wellbeing:  Category I Pain Control:  Epidural I/D:  n/a Anticipated MOD:  NSVD  Kijana Cromie STACIA 04/30/2014, 8:22 AM

## 2014-04-30 NOTE — Lactation Note (Signed)
This note was copied from the chart of Anita Harrison. Lactation Consultation Note Initial visit at 11 hours of age. [facility] resources given and discussed.  Encouraged to feed with early cues on demand.  Early newborn behavior discussed.  Hand expression demonstrated with colostrum visible.  Mom is ready to attempt feeding now, but mom declines assist.   Mom to call for assist as needed.    Patient Name: Anita Harrison ZOXWR'UToday's Date: 04/30/2014 Reason for consult: Initial assessment   Maternal Data Has patient been taught Hand Expression?: Yes Does the patient have breastfeeding experience prior to this delivery?: Yes  Feeding Feeding Type: Breast Fed  LATCH Score/Interventions                Intervention(s): Breastfeeding basics reviewed     Lactation Tools Discussed/Used     Consult Status Consult Status: Follow-up Date: 05/01/14 Follow-up type: In-patient    Jannifer RodneyShoptaw, Glorie Dowlen Lynn 04/30/2014, 10:07 PM

## 2014-05-01 LAB — CBC
HCT: 34 % — ABNORMAL LOW (ref 36.0–46.0)
Hemoglobin: 11.2 g/dL — ABNORMAL LOW (ref 12.0–15.0)
MCH: 28.5 pg (ref 26.0–34.0)
MCHC: 32.9 g/dL (ref 30.0–36.0)
MCV: 86.5 fL (ref 78.0–100.0)
Platelets: 215 10*3/uL (ref 150–400)
RBC: 3.93 MIL/uL (ref 3.87–5.11)
RDW: 15.5 % (ref 11.5–15.5)
WBC: 11.3 10*3/uL — ABNORMAL HIGH (ref 4.0–10.5)
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
## 2014-05-01 NOTE — Anesthesia Postprocedure Evaluation (Signed)
  Anesthesia Post-op Note  Patient: Domenic MorasYaira O Milian-Mesa  Procedure(s) Performed: * No procedures listed *  Patient Location: Mother/Baby  Anesthesia Type:Epidural  Level of Consciousness: awake  Airway and Oxygen Therapy: Patient Spontanous Breathing  Post-op Pain: none  Post-op Assessment: Patient's Cardiovascular Status Stable, Respiratory Function Stable, Patent Airway, No signs of Nausea or vomiting, Adequate PO intake, Pain level controlled, No headache, No backache, No residual numbness and No residual motor weakness  Post-op Vital Signs: Reviewed and stable  Last Vitals:  Filed Vitals:   05/01/14 0600  BP: 106/63  Pulse: 78  Temp: 36.5 C  Resp: 16    Complications: No apparent anesthesia complications

## 2014-05-01 NOTE — Progress Notes (Signed)
Pt is doing well. She would like to go home. Baby is doing well. VSSAF IMP/ Stable Plan/ Will discharge to home.

## 2014-05-01 NOTE — Discharge Summary (Signed)
Obstetric Discharge Summary Reason for Admission: onset of labor Prenatal Procedures: ultrasound Intrapartum Procedures: spontaneous vaginal delivery Postpartum Procedures: none Complications-Operative and Postpartum: none Hemoglobin  Date Value Ref Range Status  05/01/2014 11.2* 12.0 - 15.0 g/dL Final     HCT  Date Value Ref Range Status  05/01/2014 34.0* 36.0 - 46.0 % Final    Physical Exam:  General: alert Lochia: appropriate Uterine Fundus: firm   Discharge Diagnoses: Term Pregnancy-delivered and shoulder dystocia  Discharge Information: Date: 05/01/2014 Activity: pelvic rest Diet: routine Medications: PNV and Ibuprofen Condition: stable Instructions: refer to practice specific booklet Discharge to: home Follow-up Information   [facility], Sanjuana MaeWALDA STACIA, MD. Schedule an appointment as soon as possible for a visit in 1 month.   Specialty:  Obstetrics and Gynecology   Contact information:   [address] [place] [address] [phone]       Newborn Data: Live born female  Birth Weight: 9 lb 4.5 oz (4210 g) APGAR: 8, 9  Home with mother.  ANDERSON,MARK E 05/01/2014, 12:27 PM

## 2014-05-10 NOTE — H&P (Signed)
Anita MorasYaira O Harrison is a 33 y.o. female presenting for labor History OB History as of 05/01/14   Grav Para Term Preterm Abortions TAB SAB Ect Mult Living   4 3 3  0 1 0 1 0 0 3     Past Medical History  Diagnosis Date  . Cyst of Bartholin gland or duct   . Pregnancy induced hypertension    Past Surgical History  Procedure Laterality Date  . No past surgeries     Family History: family history is not on file. Social History:  reports that she has never smoked. She has never used smokeless tobacco. She reports that she does not drink alcohol or use illicit drugs.   Prenatal Transfer Tool  Maternal Diabetes: No Genetic Screening: Normal Maternal Ultrasounds/Referrals: Normal Fetal Ultrasounds or other Referrals:  None Maternal Substance Abuse:  No Significant Maternal Medications:  None Significant Maternal Lab Results:  None Other Comments:  None  ROS  Dilation: 10 Effacement (%): 100 Station: 0 Exam by:: S Grindstaff RN Blood pressure 123/80, pulse 80, temperature 97.8 F (36.6 C), temperature source Oral, resp. rate 16, height 5\' 4"  (1.626 m), weight 102.513 kg (226 lb), SpO2 100.00%, unknown if currently breastfeeding. Exam Physical Exam  Prenatal labs: ABO, Rh: A/Positive/-- (12/17 0000) Antibody: Negative (12/17 0000) Rubella: Immune (12/17 0000) RPR: NON REAC (07/05 2200)  HBsAg: Negative (12/17 0000)  HIV: Non-reactive (12/17 0000)  GBS: Negative (06/02 0000)   Assessment/Plan: 1) admit 2) Epidural on request   Anita Harrison H. 05/10/2014, 8:45 PM

## 2014-08-27 ENCOUNTER — Encounter (HOSPITAL_COMMUNITY): Payer: Self-pay | Admitting: *Deleted

## 2015-10-07 ENCOUNTER — Encounter: Payer: Self-pay | Admitting: *Deleted

## 2015-10-14 ENCOUNTER — Ambulatory Visit (INDEPENDENT_AMBULATORY_CARE_PROVIDER_SITE_OTHER): Payer: Medicaid Other | Admitting: Family Medicine

## 2015-10-14 ENCOUNTER — Encounter: Payer: Self-pay | Admitting: Family Medicine

## 2015-10-14 VITALS — BP 119/80 | HR 68 | Wt 201.0 lb

## 2015-10-14 DIAGNOSIS — Z3009 Encounter for other general counseling and advice on contraception: Secondary | ICD-10-CM | POA: Diagnosis not present

## 2015-10-14 NOTE — Patient Instructions (Signed)
Laparoscopic Tubal Ligation Laparoscopic tubal ligation is a procedure that closes the fallopian tubes at a time other than right after childbirth. When the fallopian tubes are closed, the eggs that are released from the ovaries cannot enter the uterus, and sperm cannot reach the egg. Tubal ligation is also known as getting your "tubes tied." Tubal ligation is done so you will not be able to get pregnant or have a baby. Although this procedure may be undone (reversed), it should be considered permanent and irreversible. If you want to have future pregnancies, you should not have this procedure. LET YOUR HEALTH CARE PROVIDER KNOW ABOUT:  Any allergies you have.  All medicines you are taking, including vitamins, herbs, eye drops, creams, and over-the-counter medicines. This includes any use of steroids, either by mouth or in cream form.  Previous problems you or members of your family have had with the use of anesthetics.  Any blood disorders you have.  Previous surgeries you have had.  Any medical conditions you may have.  Possibility of pregnancy, if this applies.  Any past pregnancies. RISKS AND COMPLICATIONS  Infection.  Bleeding.  Injury to surrounding organs.  Side effects from anesthetics.  Failure of the procedure.  Ectopic pregnancy.  Future regret about having the procedure done. BEFORE THE PROCEDURE  Ask your health care provider about:  Changing or stopping your regular medicines. This is especially important if you are taking diabetes medicines or blood thinners.  Taking medicines such as aspirin and ibuprofen. These medicines can thin your blood. Do not take these medicines before your procedure if your health care provider instructs you not to.  Follow instructions from your health care provider about eating and drinking restrictions.  Plan to have someone take you home after the procedure.  If you go home right after the procedure, plan to have someone  with you for 24 hours. PROCEDURE  You will be given one or more of the following:  A medicine that helps you relax (sedative).  A medicine that numbs the area (local anesthetic).  A medicine that makes you fall asleep (general anesthetic).  A medicine that is injected into an area of your body that numbs everything below the injection site (regional anesthetic).  If you have been given general anesthetic, a tube will be put down your throat to help you breathe.  Two small cuts (incisions) will be made in the lower abdominal area and near the belly button.  Your bladder may be emptied with a small tube (catheter).  Your abdomen will be inflated with a safe gas (carbon dioxide). This will help to give the surgeon room to operate and visualize, and it will help the surgeon to avoid other organs.  A thin, lighted tube (laparoscope) with a camera attached will be inserted into your abdomen through one of the incisions near the belly button. Other small instruments will be inserted through the other abdominal incision.  The fallopian tubes will be tied off or burned (cauterized), or they will be blocked with a clip, ring, or clamp. In many cases, a small portion in the center of each fallopian tube will also be removed.  After the fallopian tubes are blocked, the gas will be released from the abdomen.  The incisions will be closed with stitches (sutures).  A bandage (dressing) will be placed over the incisions. The procedure may vary among health care providers and hospitals. AFTER THE PROCEDURE  Your blood pressure, heart rate, breathing rate, and blood oxygen level   will be monitored often until the medicines you were given have worn off.  You will be given pain medicine as needed.  If you had general anesthetic, you may have some mild discomfort in your throat. This is from the breathing tube that was placed in your throat while you were sleeping.  You may experience discomfort in  the shoulder area from some trapped air between your liver and your diaphragm. This sensation is normal, and it will slowly go away on its own.  You will have some mild abdominal discomfort for 3--7 days.   This information is not intended to replace advice given to you by your health care provider. Make sure you discuss any questions you have with your health care provider.   Document Released: 01/18/2001 Document Revised: 02/26/2015 Document Reviewed: 01/23/2012 Elsevier Interactive Patient Education 2016 [employer]  

## 2015-10-14 NOTE — Progress Notes (Signed)
    Subjective:    Patient ID: Anita Harrison is a 34 y.o. female presenting with Advice Only  on 10/14/2015  HPI: Here for sterilization consult.  [id number] who desires permanent sterilization.  Review of Systems  Constitutional: Negative for fever and chills.  Respiratory: Negative for shortness of breath.   Cardiovascular: Negative for chest pain.  Gastrointestinal: Negative for nausea, vomiting and abdominal pain.  Genitourinary: Negative for dysuria.  Skin: Negative for rash.      Objective:    BP 119/80 mmHg  Pulse 68  Wt 201 lb (91.173 kg)  LMP 10/04/2015  Breastfeeding? No Physical Exam  Constitutional: She is oriented to person, place, and time. She appears well-developed and well-nourished. No distress.  HENT:  Head: Normocephalic and atraumatic.  Eyes: No scleral icterus.  Neck: Neck supple.  Cardiovascular: Normal rate.   Pulmonary/Chest: Effort normal.  Abdominal: Soft.  Neurological: She is alert and oriented to person, place, and time.  Skin: Skin is warm and dry.  Psychiatric: She has a normal mood and affect.        Assessment & Plan:   Problem List Items Addressed This Visit    None    Visit Diagnoses    Sterilization consult    -  Primary    book for Laparoscopic BTL with Filshie clips.     Patient counseled, r.e. Risks benefits of BTL, including permanency of procedure, risk of failure(1:100), increased risk of ectopic.  Patient verbalized understanding and desires to proceed  Risks include but are not limited to bleeding, infection, injury to surrounding structures, including bowel, bladder and ureters, blood clots, and death.  Likelihood of success is high.  Total face-to-face time with patient: 30 minutes. Over 50% of encounter was spent on counseling and coordination of care. Return in about 3 months (around 01/12/2016) for postop check.  Elaine Middleton S 10/14/2015 2:43 PM

## 2015-10-15 ENCOUNTER — Encounter: Payer: Self-pay | Admitting: *Deleted

## 2015-10-15 ENCOUNTER — Encounter (HOSPITAL_COMMUNITY): Payer: Self-pay | Admitting: *Deleted

## 2015-11-19 ENCOUNTER — Encounter (HOSPITAL_COMMUNITY): Payer: Self-pay

## 2015-12-07 DIAGNOSIS — Z302 Encounter for sterilization: Secondary | ICD-10-CM

## 2015-12-07 NOTE — H&P (Signed)
  Anita Harrison is an 35 y.o. [phone] female.   Chief Complaint: Undesired fertility HPI: Desires permanent sterility.  Past Medical History  Diagnosis Date  . Cyst of Bartholin gland or duct   . Pregnancy induced hypertension     Past Surgical History  Procedure Laterality Date  . No past surgeries    . Therapeutic abortion      History reviewed. No pertinent family history. Social History:  reports that she has never smoked. She has never used smokeless tobacco. She reports that she does not drink alcohol or use illicit drugs.  Allergies: No Known Allergies  No current facility-administered medications on file prior to encounter.   No current outpatient prescriptions on file prior to encounter.    A comprehensive review of systems was negative.  Last menstrual period 11/17/2015, not currently breastfeeding. General appearance: alert, cooperative and appears stated age Neck: supple, symmetrical, trachea midline Lungs: normal effort Heart: regular rate and rhythm Abdomen: soft, non-tender; bowel sounds normal; no masses,  no organomegaly Extremities: extremities normal, atraumatic, no cyanosis or edema Skin: Skin color, texture, turgor normal. No rashes or lesions Neurologic: Grossly normal   Lab Results  Component Value Date   WBC 11.3* 05/01/2014   HGB 11.2* 05/01/2014   HCT 34.0* 05/01/2014   MCV 86.5 05/01/2014   PLT 215 05/01/2014   Lab Results  Component Value Date   PREGTESTUR NEGATIVE 12/05/2012     Assessment/Plan  Principal Problem:   Encounter for sterilization    Risks include but are not limited to bleeding, infection, injury to surrounding structures, including bowel, bladder and ureters, blood clots, and death.  Likelihood of success is high. Patient counseled, r.e. Risks benefits of BTL, including permanency of procedure, risk of failure(1:100), increased risk of ectopic.  Patient verbalized understanding and desires to  proceed     Kyian Obst S 12/07/2015, 7:28 PM

## 2015-12-10 ENCOUNTER — Ambulatory Visit (HOSPITAL_COMMUNITY): Payer: Medicaid Other | Admitting: Anesthesiology

## 2015-12-10 ENCOUNTER — Ambulatory Visit (HOSPITAL_COMMUNITY)
Admission: RE | Admit: 2015-12-10 | Discharge: 2015-12-10 | Disposition: A | Discharge status: Home / Self Care | Payer: Medicaid Other | Source: Ambulatory Visit | Attending: Family Medicine | Admitting: Family Medicine

## 2015-12-10 ENCOUNTER — Encounter (HOSPITAL_COMMUNITY)
Admission: RE | Disposition: A | Discharge status: Home / Self Care | Payer: Self-pay | Source: Ambulatory Visit | Attending: Family Medicine

## 2015-12-10 ENCOUNTER — Encounter (HOSPITAL_COMMUNITY): Payer: Self-pay | Admitting: Family Medicine

## 2015-12-10 ENCOUNTER — Encounter (HOSPITAL_COMMUNITY): Payer: Self-pay | Admitting: *Deleted

## 2015-12-10 DIAGNOSIS — Z302 Encounter for sterilization: Secondary | ICD-10-CM | POA: Diagnosis not present

## 2015-12-10 HISTORY — PX: LAPAROSCOPIC TUBAL LIGATION: SHX1937

## 2015-12-10 LAB — PREGNANCY, URINE: PREG TEST UR: NEGATIVE

## 2015-12-10 LAB — CBC
HEMATOCRIT: 40.7 % (ref 36.0–46.0)
HEMOGLOBIN: 14 g/dL (ref 12.0–15.0)
MCH: 29.4 pg (ref 26.0–34.0)
MCHC: 34.4 g/dL (ref 30.0–36.0)
MCV: 85.3 fL (ref 78.0–100.0)
Platelets: 272 10*3/uL (ref 150–400)
RBC: 4.77 MIL/uL (ref 3.87–5.11)
RDW: 13.3 % (ref 11.5–15.5)
WBC: 12.2 10*3/uL — AB (ref 4.0–10.5)

## 2015-12-10 SURGERY — LIGATION, FALLOPIAN TUBE, LAPAROSCOPIC
Anesthesia: General | Site: Abdomen | Laterality: Bilateral

## 2015-12-10 MED ORDER — MIDAZOLAM HCL 2 MG/2ML IJ SOLN
INTRAMUSCULAR | Status: AC
  Filled 2015-12-10: qty 2

## 2015-12-10 MED ORDER — OXYCODONE-ACETAMINOPHEN 5-325 MG PO TABS: 1.0000 | ORAL_TABLET | Freq: Four times a day (QID) | ORAL | Status: DC | PRN

## 2015-12-10 MED ORDER — GLYCOPYRROLATE 0.2 MG/ML IJ SOLN
INTRAMUSCULAR | Status: AC
  Filled 2015-12-10: qty 2

## 2015-12-10 MED ORDER — SCOPOLAMINE 1 MG/3DAYS TD PT72
MEDICATED_PATCH | TRANSDERMAL | Status: AC
  Administered 2015-12-10: 1.5 mg via TRANSDERMAL
  Filled 2015-12-10: qty 1

## 2015-12-10 MED ORDER — ONDANSETRON HCL 4 MG/2ML IJ SOLN
INTRAMUSCULAR | Status: DC | PRN
  Administered 2015-12-10: 4 mg via INTRAVENOUS

## 2015-12-10 MED ORDER — BUPIVACAINE HCL (PF) 0.25 % IJ SOLN
INTRAMUSCULAR | Status: AC
  Filled 2015-12-10: qty 30

## 2015-12-10 MED ORDER — FENTANYL CITRATE (PF) 100 MCG/2ML IJ SOLN
25.0000 ug | INTRAMUSCULAR | Status: DC | PRN
Start: 2015-12-10 — End: 2015-12-10
  Administered 2015-12-10: 50 ug via INTRAVENOUS

## 2015-12-10 MED ORDER — HYDROMORPHONE HCL 1 MG/ML IJ SOLN
INTRAMUSCULAR | Status: AC
  Filled 2015-12-10: qty 1

## 2015-12-10 MED ORDER — ONDANSETRON HCL 4 MG/2ML IJ SOLN
INTRAMUSCULAR | Status: AC
Start: 2015-12-10 — End: 2015-12-10
  Filled 2015-12-10: qty 2

## 2015-12-10 MED ORDER — LIDOCAINE HCL (CARDIAC) 20 MG/ML IV SOLN
INTRAVENOUS | Status: DC | PRN
  Administered 2015-12-10: 60 mg via INTRAVENOUS

## 2015-12-10 MED ORDER — DEXAMETHASONE SODIUM PHOSPHATE 4 MG/ML IJ SOLN
INTRAMUSCULAR | Status: DC | PRN
  Administered 2015-12-10: 4 mg via INTRAVENOUS

## 2015-12-10 MED ORDER — GLYCOPYRROLATE 0.2 MG/ML IJ SOLN
INTRAMUSCULAR | Status: DC | PRN
  Administered 2015-12-10: 0.4 mg via INTRAVENOUS

## 2015-12-10 MED ORDER — PROPOFOL 500 MG/50ML IV EMUL: INTRAVENOUS | Status: DC | PRN

## 2015-12-10 MED ORDER — LACTATED RINGERS IV SOLN
INTRAVENOUS | Status: DC
  Administered 2015-12-10 (×2): via INTRAVENOUS

## 2015-12-10 MED ORDER — HYDROMORPHONE HCL 1 MG/ML IJ SOLN
INTRAMUSCULAR | Status: DC | PRN
Start: 2015-12-10 — End: 2015-12-10
  Administered 2015-12-10: 1 mg via INTRAVENOUS

## 2015-12-10 MED ORDER — NEOSTIGMINE METHYLSULFATE 10 MG/10ML IV SOLN
INTRAVENOUS | Status: AC
  Filled 2015-12-10: qty 1

## 2015-12-10 MED ORDER — LACTATED RINGERS IV SOLN: INTRAVENOUS | Status: DC

## 2015-12-10 MED ORDER — FENTANYL CITRATE (PF) 100 MCG/2ML IJ SOLN
INTRAMUSCULAR | Status: AC
  Filled 2015-12-10: qty 2

## 2015-12-10 MED ORDER — SCOPOLAMINE 1 MG/3DAYS TD PT72
MEDICATED_PATCH | TRANSDERMAL | Status: AC
  Filled 2015-12-10: qty 1

## 2015-12-10 MED ORDER — BUPIVACAINE HCL (PF) 0.25 % IJ SOLN
INTRAMUSCULAR | Status: DC | PRN
  Administered 2015-12-10: 5 mL

## 2015-12-10 MED ORDER — ROCURONIUM BROMIDE 100 MG/10ML IV SOLN
INTRAVENOUS | Status: DC | PRN
  Administered 2015-12-10: 30 mg via INTRAVENOUS

## 2015-12-10 MED ORDER — FENTANYL CITRATE (PF) 100 MCG/2ML IJ SOLN
INTRAMUSCULAR | Status: DC | PRN
  Administered 2015-12-10 (×2): 100 ug via INTRAVENOUS

## 2015-12-10 MED ORDER — LIDOCAINE HCL (CARDIAC) 20 MG/ML IV SOLN
INTRAVENOUS | Status: AC
  Filled 2015-12-10: qty 5

## 2015-12-10 MED ORDER — FENTANYL CITRATE (PF) 100 MCG/2ML IJ SOLN
INTRAMUSCULAR | Status: DC
Start: 2015-12-10 — End: 2015-12-10
  Filled 2015-12-10: qty 2

## 2015-12-10 MED ORDER — PROPOFOL 10 MG/ML IV BOLUS
INTRAVENOUS | Status: AC
  Filled 2015-12-10: qty 20

## 2015-12-10 MED ORDER — DEXAMETHASONE SODIUM PHOSPHATE 4 MG/ML IJ SOLN
INTRAMUSCULAR | Status: AC
  Filled 2015-12-10: qty 1

## 2015-12-10 MED ORDER — SCOPOLAMINE 1 MG/3DAYS TD PT72
1.0000 | MEDICATED_PATCH | Freq: Once | TRANSDERMAL | Status: DC
  Administered 2015-12-10: 1.5 mg via TRANSDERMAL

## 2015-12-10 MED ORDER — KETOROLAC TROMETHAMINE 30 MG/ML IJ SOLN
INTRAMUSCULAR | Status: DC | PRN
  Administered 2015-12-10: 30 mg via INTRAVENOUS

## 2015-12-10 MED ORDER — MIDAZOLAM HCL 5 MG/5ML IJ SOLN
INTRAMUSCULAR | Status: DC | PRN
  Administered 2015-12-10: 2 mg via INTRAVENOUS

## 2015-12-10 MED ORDER — NEOSTIGMINE METHYLSULFATE 10 MG/10ML IV SOLN
INTRAVENOUS | Status: DC | PRN
  Administered 2015-12-10: 2 mg via INTRAVENOUS

## 2015-12-10 SURGICAL SUPPLY — 20 items
CATH ROBINSON RED A/P 16FR (CATHETERS) ×3 IMPLANT
CHLORAPREP W/TINT 26ML (MISCELLANEOUS) ×3 IMPLANT
CLIP FILSHIE TUBAL LIGA STRL (Clip) ×5 IMPLANT
CLOTH BEACON ORANGE TIMEOUT ST (SAFETY) ×3 IMPLANT
DRSG COVADERM PLUS 2X2 (GAUZE/BANDAGES/DRESSINGS) ×6 IMPLANT
DRSG OPSITE POSTOP 3X4 (GAUZE/BANDAGES/DRESSINGS) ×2 IMPLANT
GLOVE BIOGEL PI IND STRL 7.0 (GLOVE) ×2 IMPLANT
GLOVE BIOGEL PI INDICATOR 7.0 (GLOVE) ×4
GLOVE ECLIPSE 7.0 STRL STRAW (GLOVE) ×6 IMPLANT
GOWN STRL REUS W/TWL LRG LVL3 (GOWN DISPOSABLE) ×6 IMPLANT
PACK LAPAROSCOPY BASIN (CUSTOM PROCEDURE TRAY) ×3 IMPLANT
PAD TRENDELENBURG POSITION (MISCELLANEOUS) ×3 IMPLANT
SLEEVE XCEL OPT CAN 5 100 (ENDOMECHANICALS) ×3 IMPLANT
SUT VIC AB 3-0 X1 27 (SUTURE) ×3 IMPLANT
SUT VICRYL 0 UR6 27IN ABS (SUTURE) ×6 IMPLANT
TOWEL OR 17X24 6PK STRL BLUE (TOWEL DISPOSABLE) ×6 IMPLANT
TROCAR BALLN 12MMX100 BLUNT (TROCAR) ×3 IMPLANT
TROCAR XCEL NON-BLD 5MMX100MML (ENDOMECHANICALS) ×3 IMPLANT
WARMER LAPAROSCOPE (MISCELLANEOUS) ×3 IMPLANT
WATER STERILE IRR 1000ML POUR (IV SOLUTION) ×3 IMPLANT

## 2015-12-10 NOTE — Transfer of Care (Signed)
Immediate Anesthesia Transfer of Care Note  Patient: Anita Harrison  Procedure(s) Performed: Procedure(s): LAPAROSCOPIC TUBAL LIGATION (Bilateral)  Patient Location: PACU  Anesthesia Type:General  Level of Consciousness: awake, alert  and oriented  Airway & Oxygen Therapy: Patient Spontanous Breathing and Patient connected to nasal cannula oxygen  Post-op Assessment: Report given to RN and Post -op Vital signs reviewed and stable  Post vital signs: Reviewed and stable  Last Vitals:  Filed Vitals:   12/10/15 1410  BP: 123/79  Pulse: 74  Temp: 36.8 C  Resp: 18    Complications: No apparent anesthesia complications

## 2015-12-10 NOTE — Discharge Instructions (Signed)
Laparoscopic Tubal Ligation Laparoscopic tubal ligation is a procedure that closes the fallopian tubes at a time other than right after childbirth. When the fallopian tubes are closed, the eggs that are released from the ovaries cannot enter the uterus, and sperm cannot reach the egg. Tubal ligation is also known as getting your "tubes tied." Tubal ligation is done so you will not be able to get pregnant or have a baby. Although this procedure may be undone (reversed), it should be considered permanent and irreversible. If you want to have future pregnancies, you should not have this procedure. LET [facility] CARE PROVIDER KNOW ABOUT:  Any allergies you have.  All medicines you are taking, including vitamins, herbs, eye drops, creams, and over-the-counter medicines. This includes any use of steroids, either by mouth or in cream form.  Previous problems you or members of your family have had with the use of anesthetics.  Any blood disorders you have.  Previous surgeries you have had.  Any medical conditions you may have.  Possibility of pregnancy, if this applies.  Any past pregnancies. RISKS AND COMPLICATIONS  Infection.  Bleeding.  Injury to surrounding organs.  Side effects from anesthetics.  Failure of the procedure.  Ectopic pregnancy.  Future regret about having the procedure done. BEFORE THE PROCEDURE  Ask your health care provider about:  Changing or stopping your regular medicines. This is especially important if you are taking diabetes medicines or blood thinners.  Taking medicines such as aspirin and ibuprofen. These medicines can thin your blood. Do not take these medicines before your procedure if your health care provider instructs you not to.  Follow instructions from your health care provider about eating and drinking restrictions.  Plan to have someone take you home after the procedure.  If you go home right after the procedure, plan to have someone  with you for 24 hours. PROCEDURE  You will be given one or more of the following:  A medicine that helps you relax (sedative).  A medicine that numbs the area (local anesthetic).  A medicine that makes you fall asleep (general anesthetic).  A medicine that is injected into an area of your body that numbs everything below the injection site (regional anesthetic).  If you have been given general anesthetic, a tube will be put down your throat to help you breathe.  Two small cuts (incisions) will be made in the lower abdominal area and near the belly button.  Your bladder may be emptied with a small tube (catheter).  Your abdomen will be inflated with a safe gas (carbon dioxide). This will help to give the surgeon room to operate and visualize, and it will help the surgeon to avoid other organs.  A thin, lighted tube (laparoscope) with a camera attached will be inserted into your abdomen through one of the incisions near the belly button. Other small instruments will be inserted through the other abdominal incision.  The fallopian tubes will be tied off or burned (cauterized), or they will be blocked with a clip, ring, or clamp. In many cases, a small portion in the center of each fallopian tube will also be removed.  After the fallopian tubes are blocked, the gas will be released from the abdomen.  The incisions will be closed with stitches (sutures).  A bandage (dressing) will be placed over the incisions. The procedure may vary among health care providers and hospitals. AFTER THE PROCEDURE  Your blood pressure, heart rate, breathing rate, and blood oxygen level  will be monitored often until the medicines you were given have worn off.  You will be given pain medicine as needed.  If you had general anesthetic, you may have some mild discomfort in your throat. This is from the breathing tube that was placed in your throat while you were sleeping.  You may experience discomfort in  the shoulder area from some trapped air between your liver and your diaphragm. This sensation is normal, and it will slowly go away on its own.  You will have some mild abdominal discomfort for 3--7 days.   This information is not intended to replace advice given to you by your health care provider. Make sure you discuss any questions you have with your health care provider.   Document Released: 01/18/2001 Document Revised: 02/26/2015 Document Reviewed: 01/23/2012 Elsevier Interactive Patient Education 2016 [employer] DISCHARGE INSTRUCTIONS: Laparoscopy  The following instructions have been prepared to help you care for yourself upon your return home today.  Wound care:  Do not get the incision wet for the first 24 hours. The incision should be kept clean and dry.  The Band-Aids or dressings may be removed the day after surgery.  Should the incision become sore, red, and swollen after the first week, check with your doctor.  Personal hygiene:  Shower the day after your procedure.  Activity and limitations:  Do NOT drive or operate any equipment today.  Do NOT lift anything more than 15 pounds for 2-3 weeks after surgery.  Do NOT rest in bed all day.  Walking is encouraged. Walk each day, starting slowly with 5-minute walks 3 or 4 times a day. Slowly increase the length of your walks.  Walk up and down stairs slowly.  Do NOT do strenuous activities, such as golfing, playing tennis, bowling, running, biking, weight lifting, gardening, mowing, or vacuuming for 2-4 weeks. Ask your doctor when it is okay to start.  Diet: Eat a light meal as desired this evening. You may resume your usual diet tomorrow.  Return to work: This is dependent on the type of work you do. For the most part you can return to a desk job within a week of surgery. If you are more active at work, please discuss this with your doctor.  What to expect after your surgery: You may have a slight burning  sensation when you urinate on the first day. You may have a very small amount of blood in the urine. Expect to have a small amount of vaginal discharge/light bleeding for 1-2 weeks. It is not unusual to have abdominal soreness and bruising for up to 2 weeks. You may be tired and need more rest for about 1 week. You may experience shoulder pain for 24-72 hours. Lying flat in bed may relieve it.  Call your doctor for any of the following:  Develop a fever of 100.4 or greater  Inability to urinate 6 hours after discharge from hospital  Severe pain not relieved by pain medications  Persistent of heavy bleeding at incision site  Redness or swelling around incision site after a week  Increasing nausea or vomiting  Patient Signature________________________________________ Nurse Signature_________________________________________ Post Anesthesia Home Care Instructions  Activity: Get plenty of rest for the remainder of the day. A responsible adult should stay with you for 24 hours following the procedure.  For the next 24 hours, DO NOT: -Drive a car -Advertising copywriter -Drink alcoholic beverages -Take any medication unless instructed by your physician -Make any legal decisions or sign  important papers.  Meals: Start with liquid foods such as gelatin or soup. Progress to regular foods as tolerated. Avoid greasy, spicy, heavy foods. If nausea and/or vomiting occur, drink only clear liquids until the nausea and/or vomiting subsides. Call your physician if vomiting continues.  Special Instructions/Symptoms: Your throat may feel dry or sore from the anesthesia or the breathing tube placed in your throat during surgery. If this causes discomfort, gargle with warm salt water. The discomfort should disappear within 24 hours.  If you had a scopolamine patch placed behind your ear for the management of post- operative nausea and/or vomiting:  1. The medication in the patch is effective for 72 hours,  after which it should be removed.  Wrap patch in a tissue and discard in the trash. Wash hands thoroughly with soap and water. 2. You may remove the patch earlier than 72 hours if you experience unpleasant side effects which may include dry mouth, dizziness or visual disturbances. 3. Avoid touching the patch. Wash your hands with soap and water after contact with the patch.

## 2015-12-10 NOTE — Anesthesia Postprocedure Evaluation (Signed)
Anesthesia Post Note  Patient: Anita Harrison  Procedure(s) Performed: Procedure(s) (LRB): LAPAROSCOPIC TUBAL LIGATION (Bilateral)  Patient location during evaluation: PACU Anesthesia Type: General Level of consciousness: sedated Pain management: satisfactory to patient Vital Signs Assessment: post-procedure vital signs reviewed and stable Respiratory status: spontaneous breathing Cardiovascular status: stable Anesthetic complications: no    Last Vitals:  Filed Vitals:   12/10/15 1645 12/10/15 1700  BP: 120/67 122/70  Pulse: 70 72  Temp:  37.2 C  Resp: 13 14    Last Pain:  Filed Vitals:   12/10/15 1704  PainSc: 0-No pain                 Deliah Strehlow EDWARD

## 2015-12-10 NOTE — Anesthesia Preprocedure Evaluation (Signed)
Anesthesia Evaluation  Patient identified by MRN, date of birth, ID band Patient awake    Reviewed: Allergy & Precautions, H&P , Patient's Chart, lab work & pertinent test results, reviewed documented beta blocker date and time   Airway Mallampati: II  TM Distance: >3 FB Neck ROM: full    Dental no notable dental hx.    Pulmonary    Pulmonary exam normal breath sounds clear to auscultation       Cardiovascular hypertension,  Rhythm:regular Rate:Normal     Neuro/Psych    GI/Hepatic   Endo/Other    Renal/GU      Musculoskeletal   Abdominal   Peds  Hematology   Anesthesia Other Findings   Reproductive/Obstetrics                             Anesthesia Physical Anesthesia Plan  ASA: II  Anesthesia Plan: General   Post-op Pain Management:    Induction: Intravenous  Airway Management Planned: Oral ETT  Additional Equipment:   Intra-op Plan:   Post-operative Plan: Extubation in OR  Informed Consent: I have reviewed the patients History and Physical, chart, labs and discussed the procedure including the risks, benefits and alternatives for the proposed anesthesia with the patient or authorized representative who has indicated his/her understanding and acceptance.   Dental Advisory Given and Dental advisory given  Plan Discussed with: CRNA and Surgeon  Anesthesia Plan Comments: (  Discussed general anesthesia, including possible nausea, instrumentation of airway, sore throat,pulmonary aspiration, etc. I asked if the were any outstanding questions, or  concerns before we proceeded. )        Anesthesia Quick Evaluation  

## 2015-12-10 NOTE — Op Note (Signed)
Preoperative diagnosis: Multiparity and Undesired fertility  Postoperative diagnosis: Same  Procedure: Laparoscopic BTL  Surgeon: Tinnie Gens, MD  Anesthesia: Cassandria Santee, MD  Findings: Normal uterus tubes and ovaries  Estimated blood loss: Minimal  Complications: None known  Specimens: None  Reason for procedure: 35 y.o. [id number]  who desires permanent sterility. Patient counseled, r.e. Risks benefits of BTL, including permanency of procedure, risk of failure(1:100), increased risk of ectopic.  Patient verbalized understanding and desires to proceed  Procedure: Patient was taken to the operating room was placed in dorsal lithotomy in Pastura stirrups. She was prepped and draped in the usual sterile fashion. A timeout was performed. SCDs were in place. Red rubber catheter is used to drain bladder. Speculum was placed inside the vagina. The cervix was visualized and grasped anteriorly with a single-tooth tenaculum. A Hulka tenaculum was placed through the cervix for uterine manipulation. The single tooth tenaculum and speculum were removed from the vagina.  Attention was then turned to the abdomen. Four cc of 0.25% Marcaine was injected at the umbilicus. Two Allis clamps were used to tent up the skin of the umbilicus a vertical one half centimeter incision was made here. The fascia was incised with the knife  And the peritoneum was entered sharply with this incision. A purse string suture of 0-Vicryl on a UR-6 was placed in the fascia. A Hassan trocar was placed through this incision and a pneumoperitoneum was created. The patient was then placed in Trendelenburg. The uterus was identified.  The right and left tubes were identified and followed to their fimbriated ends.  Filshie clips placed across these at 1.5 cm from cornu bilaterally.  Pictures were made. Pneumoperitoneum let down and instruments removed from abdomen. Previous mentioned purse string suture tied down.  Skin closed with 3-0  Vicryl on an X-1 in a subcuticular fashion.  Dermabond and sterile dressing applied.  All instrument, needle and lap counts correct.  Patient awakened and taken to recovery in stable condition.   Jozalyn Baglio S 12/10/2015 3:29 PM

## 2015-12-10 NOTE — Interval H&P Note (Signed)
History and Physical Interval Note:  12/10/2015 2:16 PM  Anita Harrison  has presented today for surgery, with the diagnosis of - Undesired fertility  The various methods of treatment have been discussed with the patient and family. After consideration of risks, benefits and other options for treatment, the patient has consented to  Procedure(s): LAPAROSCOPIC TUBAL LIGATION (Bilateral) as a surgical intervention .  The patient's history has been reviewed, patient examined, no change in status, stable for surgery.  I have reviewed the patient's chart and labs.  Questions were answered to the patient's satisfaction.     Degan Hanser S

## 2015-12-11 ENCOUNTER — Encounter (HOSPITAL_COMMUNITY): Payer: Self-pay | Admitting: Family Medicine

## 2015-12-11 MED FILL — Propofol IV Emul 200 MG/20ML (10 MG/ML): INTRAVENOUS | Qty: 20 | Status: AC

## 2015-12-17 MED ORDER — PROPOFOL 500 MG/50ML IV EMUL
INTRAVENOUS | Status: DC | PRN
  Administered 2015-12-10: 15 mL via INTRAVENOUS

## 2015-12-17 NOTE — Addendum Note (Signed)
Addendum  created 12/17/15 2303 by Janeece Agee, CRNA   Modules edited: Anesthesia Medication Administration

## 2016-01-01 ENCOUNTER — Ambulatory Visit (INDEPENDENT_AMBULATORY_CARE_PROVIDER_SITE_OTHER): Payer: Self-pay | Admitting: Family Medicine

## 2016-01-01 ENCOUNTER — Encounter: Payer: Self-pay | Admitting: Family Medicine

## 2016-01-01 VITALS — BP 118/76 | HR 82 | Resp 16 | Ht 64.0 in | Wt 207.0 lb

## 2016-01-01 DIAGNOSIS — Z09 Encounter for follow-up examination after completed treatment for conditions other than malignant neoplasm: Secondary | ICD-10-CM

## 2016-01-01 NOTE — Patient Instructions (Signed)
Sterilization Information, Female  Female sterilization is a procedure to permanently prevent pregnancy. There are different ways to perform sterilization, but all either block or close the fallopian tubes so that your eggs cannot reach your uterus. If your egg cannot reach your uterus, sperm cannot fertilize the egg, and you cannot get pregnant.   Sterilization is performed by a surgical procedure. Sometimes these procedures are performed in a hospital while a patient is asleep. Sometimes they can be done in a clinic setting with the patient awake. The fallopian tubes can be surgically cut, tied, or sealed through a procedure called tubal ligation. The fallopian tubes can also be closed with clips or rings. Sterilization can also be done by placing a tiny coil into each fallopian tube, which causes scar tissue to grow inside the tube. The scar tissue then blocks the tubes.   Discuss sterilization with your caregiver to answer any concerns you or your partner may have. You may want to ask what type of sterilization your caregiver performs. Some caregivers may not perform all the various options. Sterilization is permanent and should only be done if you are sure you do not want children or do not want any more children. Having a sterilization reversed may not be successful.   STERILIZATION PROCEDURES  · Laparoscopic sterilization. This is a surgical method performed at a time other than right after childbirth. Two incisions are made in the lower abdomen. A thin, lighted tube (laparoscope) is inserted into one of the incisions and is used to perform the procedure. The fallopian tubes are closed with a ring or a clip. An instrument that uses heat could be used to seal the tubes closed (electrocautery).    · Mini-laparotomy. This is a surgical method done 1 or 2 days after giving birth. Typically, a small incision is made just below the belly button (umbilicus) and the fallopian tubes are exposed. The tubes can then be  sealed, tied, or cut.    · Hysteroscopic sterilization. This is performed at a time other than right after childbirth. A tiny, spring-like coil is inserted through the cervix and uterus and placed into the fallopian tubes. The coil causes scaring and blocks the tubes. Other forms of contraception should be used for 3 months after the procedure to allow the scar tissue to form completely. Additionally, it is required hysterosalpingography be done 3 months later to ensure that the procedure was successful.  Hysterosalpingography is a procedure that uses X-rays to look at your uterus and fallopian tubes after a material to make them show up better has been inserted.  IS STERILIZATION SAFE?  Sterilization is considered safe with very rare complications. Risks depend on the type of procedure you have. As with any surgical procedure, there are risks. Some risks of sterilization by any means include:   · Bleeding.  · Infection.  · Reaction to anesthesia medicine.  · Injury to surrounding organs.  Risks specific to having hysteroscopic coils placed include:  · The coils may not be placed correctly the first time.     · The coils may move out of place.    · The tubes may not get completely blocked after 3 months.    · Injury to surrounding organs when placing the coil.    HOW EFFECTIVE IS FEMALE STERILIZATION?  Sterilization is nearly 100% effective, but it can fail. Depending on the type of sterilization, the rate of failure can be as high as 3%. After hysteroscopic sterilization with placement of fallopian tube coils, you will need back-up birth   control for 3 months after the procedure. Sterilization is effective for a lifetime.   BENEFITS OF STERILIZATION  · It does not affect your hormones, and therefore will not affect your menstrual periods, sexual desire, or performance.    · It is effective for a lifetime.    · It is safe.    · You do not need to worry about getting pregnant. Keep in mind that if you had the  hysteroscopic placement procedure, you must wait 3 months after the procedure (or until your caregiver confirms) before pregnancy is not considered possible.    · There are no side effects unlike other types of birth control (contraception).    DRAWBACKS OF STERILIZATION  · You must be sure you do not want children or any more children. The procedure is permanent.    · It does not provide protection against sexually transmitted infections (STIs).    · The tubes can grow back together. If this happens, there is a risk of pregnancy. There is also an increased risk (50%) of pregnancy being an ectopic pregnancy. This is a pregnancy that happens outside of the uterus.     This information is not intended to replace advice given to you by your health care provider. Make sure you discuss any questions you have with your health care provider.     Document Released: 03/30/2008 Document Revised: 10/17/2013 Document Reviewed: 01/28/2012  Elsevier Interactive Patient Education ©2016 [employer]

## 2016-01-01 NOTE — Progress Notes (Signed)
    Subjective:    Patient ID: Anita JanusYaira Harrison is a 35 y.o. female presenting with Routine Post Op  on 01/01/2016  HPI: Here for post op check.  S/p laparoscopic BTL. Healing well. No complaints.  Review of Systems  Constitutional: Negative for fever and chills.  Respiratory: Negative for shortness of breath.   Cardiovascular: Negative for chest pain.  Gastrointestinal: Negative for nausea, vomiting, abdominal pain, diarrhea and constipation.  Genitourinary: Negative for dysuria.  Skin: Negative for rash.      Objective:    BP 118/76 mmHg  Pulse 82  Resp 16  Ht 5\' 4"  (1.626 m)  Wt 207 lb (93.895 kg)  BMI 35.51 kg/m2  LMP 11/29/2015  Breastfeeding? No Physical Exam  Constitutional: She is oriented to person, place, and time. She appears well-developed and well-nourished. No distress.  HENT:  Head: Normocephalic and atraumatic.  Eyes: No scleral icterus.  Neck: Neck supple.  Cardiovascular: Normal rate.   Pulmonary/Chest: Effort normal.  Abdominal: Soft.  Neurological: She is alert and oriented to person, place, and time.  Skin: Skin is warm and dry.  Incision at umbilicus is well healed.  Psychiatric: She has a normal mood and affect.        Assessment & Plan:   Problem List Items Addressed This Visit    None    Visit Diagnoses    Postop check    -  Primary    Ok to resume normal activity       Return if symptoms worsen or fail to improve.  Sola Margolis S 01/01/2016 10:23 AM

## 2016-01-08 ENCOUNTER — Encounter (HOSPITAL_COMMUNITY): Payer: Self-pay | Admitting: Family Medicine

## 2016-01-08 ENCOUNTER — Emergency Department (HOSPITAL_COMMUNITY)
Admission: EM | Admit: 2016-01-08 | Discharge: 2016-01-08 | Disposition: A | Discharge status: Home / Self Care | Payer: Medicaid Other | Attending: Emergency Medicine | Admitting: Emergency Medicine

## 2016-01-08 DIAGNOSIS — G44209 Tension-type headache, unspecified, not intractable: Secondary | ICD-10-CM | POA: Insufficient documentation

## 2016-01-08 DIAGNOSIS — Z3202 Encounter for pregnancy test, result negative: Secondary | ICD-10-CM | POA: Insufficient documentation

## 2016-01-08 DIAGNOSIS — Z8742 Personal history of other diseases of the female genital tract: Secondary | ICD-10-CM | POA: Insufficient documentation

## 2016-01-08 DIAGNOSIS — R42 Dizziness and giddiness: Secondary | ICD-10-CM | POA: Insufficient documentation

## 2016-01-08 LAB — COMPREHENSIVE METABOLIC PANEL
ALBUMIN: 3.8 g/dL (ref 3.5–5.0)
ALT: 25 U/L (ref 14–54)
ANION GAP: 6 (ref 5–15)
AST: 24 U/L (ref 15–41)
Alkaline Phosphatase: 68 U/L (ref 38–126)
BILIRUBIN TOTAL: 0.5 mg/dL (ref 0.3–1.2)
BUN: 10 mg/dL (ref 6–20)
CHLORIDE: 108 mmol/L (ref 101–111)
CO2: 27 mmol/L (ref 22–32)
Calcium: 9.6 mg/dL (ref 8.9–10.3)
Creatinine, Ser: 0.5 mg/dL (ref 0.44–1.00)
GFR calc Af Amer: >60 mL/min (ref >60)
Glucose, Bld: 98 mg/dL (ref 65–99)
POTASSIUM: 4.4 mmol/L (ref 3.5–5.1)
Sodium: 141 mmol/L (ref 135–145)
TOTAL PROTEIN: 6.9 g/dL (ref 6.5–8.1)

## 2016-01-08 LAB — URINALYSIS, ROUTINE W REFLEX MICROSCOPIC
Bilirubin Urine: NEGATIVE
Glucose, UA: NEGATIVE mg/dL
Hgb urine dipstick: NEGATIVE
Ketones, ur: NEGATIVE mg/dL
Nitrite: NEGATIVE
PROTEIN: NEGATIVE mg/dL
SPECIFIC GRAVITY, URINE: 1.014 (ref 1.005–1.030)
pH: 6 (ref 5.0–8.0)

## 2016-01-08 LAB — CBC WITH DIFFERENTIAL/PLATELET
BASOS ABS: 0.1 10*3/uL (ref 0.0–0.1)
BASOS PCT: 1 %
EOS PCT: 2 %
Eosinophils Absolute: 0.2 10*3/uL (ref 0.0–0.7)
HEMATOCRIT: 39.4 % (ref 36.0–46.0)
Hemoglobin: 13.2 g/dL (ref 12.0–15.0)
Lymphocytes Relative: 22 %
Lymphs Abs: 2 10*3/uL (ref 0.7–4.0)
MCH: 28.9 pg (ref 26.0–34.0)
MCHC: 33.5 g/dL (ref 30.0–36.0)
MCV: 86.2 fL (ref 78.0–100.0)
MONO ABS: 0.7 10*3/uL (ref 0.1–1.0)
MONOS PCT: 7 %
NEUTROS ABS: 6.1 10*3/uL (ref 1.7–7.7)
Neutrophils Relative %: 68 %
PLATELETS: 246 10*3/uL (ref 150–400)
RBC: 4.57 MIL/uL (ref 3.87–5.11)
RDW: 13.5 % (ref 11.5–15.5)
WBC: 9 10*3/uL (ref 4.0–10.5)

## 2016-01-08 LAB — PREGNANCY, URINE: PREG TEST UR: NEGATIVE

## 2016-01-08 LAB — URINE MICROSCOPIC-ADD ON

## 2016-01-08 MED ORDER — IBUPROFEN 800 MG PO TABS
800.0000 mg | ORAL_TABLET | Freq: Once | ORAL | Status: AC
  Administered 2016-01-08: 800 mg via ORAL
  Filled 2016-01-08: qty 1

## 2016-01-08 NOTE — ED Provider Notes (Signed)
CSN: [id number]     Arrival date & time 01/08/16  7829 History   First MD Initiated Contact with Patient 01/08/16 [phone]     Chief Complaint  Patient presents with  . Headache  . Dizziness     (Consider location/radiation/quality/duration/timing/severity/associated sxs/prior Treatment) Patient is a 35 y.o. female presenting with headaches and dizziness. The history is provided by the patient. No language interpreter was used.  Headache Pain location:  Generalized Radiates to:  Does not radiate Severity currently:  5/10 Severity at highest:  5/10 Onset quality:  Sudden Duration:  1 day Timing:  Constant Progression:  Worsening Chronicity:  New Similar to prior headaches: no   Context: not activity   Relieved by:  Nothing Worsened by:  Nothing Associated symptoms: dizziness   Risk factors: does not have insomnia   Dizziness Associated symptoms: headaches   Pt complains of dizziness.   Pt reports she had had a headahe.  No medical problems.    Past Medical History  Diagnosis Date  . Cyst of Bartholin gland or duct   . Pregnancy induced hypertension    Past Surgical History  Procedure Laterality Date  . No past surgeries    . Therapeutic abortion    . Laparoscopic tubal ligation Bilateral 12/10/2015    Procedure: LAPAROSCOPIC TUBAL LIGATION;  Surgeon: Reva Bores, MD;  Location: [facility];  Service: Gynecology;  Laterality: Bilateral;   History reviewed. No pertinent family history. Social History  Substance Use Topics  . Smoking status: Never Smoker   . Smokeless tobacco: Never Used  . Alcohol Use: No   OB History    Gravida Para Term Preterm AB TAB SAB Ectopic Multiple Living   0 1 0 1 0 0 3     Review of Systems  Neurological: Positive for dizziness and headaches.  All other systems reviewed and are negative.     Allergies  Review of patient's allergies indicates no known allergies.  Home Medications   Prior to Admission medications   Not on File    BP 107/78 mmHg  Pulse 87  Temp(Src) 98.7 F (37.1 C) (Oral)  Resp 17  SpO2 100%  LMP 11/29/2015 Physical Exam  Constitutional: She appears well-developed and well-nourished.  HENT:  Head: Normocephalic.  Right Ear: External ear normal.  Left Ear: External ear normal.  Nose: Nose normal.  Mouth/Throat: Oropharynx is clear and moist.  Eyes: Conjunctivae are normal. Pupils are equal, round, and reactive to light.  Neck: Normal range of motion. Neck supple.  Cardiovascular: Normal rate.   Pulmonary/Chest: Effort normal.  Abdominal: Soft.  Musculoskeletal: Normal range of motion.  Neurological: She is alert.  Skin: Skin is warm.  Nursing note and vitals reviewed.   ED Course  Procedures (including critical care time) Labs Review Labs Reviewed  URINALYSIS, ROUTINE W REFLEX MICROSCOPIC (NOT AT [facility]) - Abnormal; Notable for the following:    Leukocytes, UA SMALL (*)    All other components within normal limits  URINE MICROSCOPIC-ADD ON - Abnormal; Notable for the following:    Squamous Epithelial / LPF 6-30 (*)    Bacteria, UA FEW (*)    All other components within normal limits  CBC WITH DIFFERENTIAL/PLATELET  COMPREHENSIVE METABOLIC PANEL  PREGNANCY, URINE    Imaging Review No results found. I have personally reviewed and evaluated these images and lab results as part of my medical decision-making.   EKG Interpretation None      MDM  Pt has normal  exam.  Labs are normal.   I advised increase oral fluids.  Follow up with Primary care.   Final diagnoses:  Tension-type headache, not intractable, unspecified chronicity pattern  Dizziness    Follow up with your Physician for recheck.   Return if any problems.  An After Visit Summary was printed and given to the patient.  Lonia SkinnerLeslie K WaggamanSofia, PA-C 01/08/16 1310  Gwyneth SproutWhitney Plunkett, MD 01/09/16 1558

## 2016-01-08 NOTE — ED Notes (Signed)
Pt here for dizziness and headache x 1 week. sts recent tubal and no period since.

## 2016-01-08 NOTE — ED Notes (Signed)
Pt ambulates independently and denies any questions or concerns regarding discharge.

## 2016-01-08 NOTE — Discharge Instructions (Signed)

## 2017-02-21 ENCOUNTER — Encounter (HOSPITAL_COMMUNITY): Payer: Self-pay | Admitting: Emergency Medicine

## 2017-02-21 DIAGNOSIS — Z6834 Body mass index (BMI) 34.0-34.9, adult: Secondary | ICD-10-CM | POA: Insufficient documentation

## 2017-02-21 DIAGNOSIS — I10 Essential (primary) hypertension: Secondary | ICD-10-CM | POA: Diagnosis not present

## 2017-02-21 DIAGNOSIS — K3580 Unspecified acute appendicitis: Secondary | ICD-10-CM | POA: Diagnosis present

## 2017-02-21 DIAGNOSIS — Z9851 Tubal ligation status: Secondary | ICD-10-CM | POA: Insufficient documentation

## 2017-02-21 NOTE — ED Triage Notes (Signed)
Pt c/o pain RLQ radiating to R flank/Rleg onset 2000 tonight. +nausea, + diarrhea.  Denies urinary s/s

## 2017-02-22 ENCOUNTER — Encounter (HOSPITAL_COMMUNITY): Payer: Self-pay | Admitting: Certified Registered Nurse Anesthetist

## 2017-02-22 ENCOUNTER — Observation Stay (HOSPITAL_COMMUNITY)
Admission: EM | Admit: 2017-02-22 | Discharge: 2017-02-23 | Disposition: A | Discharge status: Home / Self Care | Payer: Medicaid Other | Attending: Surgery | Admitting: Surgery

## 2017-02-22 ENCOUNTER — Emergency Department (HOSPITAL_COMMUNITY): Payer: Medicaid Other

## 2017-02-22 ENCOUNTER — Observation Stay (HOSPITAL_COMMUNITY): Payer: Medicaid Other | Admitting: Certified Registered Nurse Anesthetist

## 2017-02-22 ENCOUNTER — Encounter (HOSPITAL_COMMUNITY)
Admission: EM | Disposition: A | Discharge status: Home / Self Care | Payer: Self-pay | Source: Home / Self Care | Attending: Emergency Medicine

## 2017-02-22 DIAGNOSIS — I10 Essential (primary) hypertension: Secondary | ICD-10-CM | POA: Diagnosis not present

## 2017-02-22 DIAGNOSIS — K3580 Unspecified acute appendicitis: Secondary | ICD-10-CM | POA: Diagnosis present

## 2017-02-22 DIAGNOSIS — Z9851 Tubal ligation status: Secondary | ICD-10-CM | POA: Diagnosis not present

## 2017-02-22 HISTORY — PX: LAPAROSCOPIC APPENDECTOMY: SHX408

## 2017-02-22 HISTORY — PX: APPENDECTOMY: SHX54

## 2017-02-22 LAB — CBC
HCT: 36.1 % (ref 36.0–46.0)
Hemoglobin: 12 g/dL (ref 12.0–15.0)
MCH: 28.2 pg (ref 26.0–34.0)
MCHC: 33.2 g/dL (ref 30.0–36.0)
MCV: 84.7 fL (ref 78.0–100.0)
PLATELETS: 254 10*3/uL (ref 150–400)
RBC: 4.26 MIL/uL (ref 3.87–5.11)
RDW: 13.8 % (ref 11.5–15.5)
WBC: 12.4 10*3/uL — ABNORMAL HIGH (ref 4.0–10.5)

## 2017-02-22 LAB — URINALYSIS, ROUTINE W REFLEX MICROSCOPIC
Bacteria, UA: NONE SEEN
Bilirubin Urine: NEGATIVE
GLUCOSE, UA: NEGATIVE mg/dL
HGB URINE DIPSTICK: NEGATIVE
Ketones, ur: NEGATIVE mg/dL
Nitrite: NEGATIVE
Protein, ur: NEGATIVE mg/dL
SPECIFIC GRAVITY, URINE: 1.017 (ref 1.005–1.030)
pH: 7 (ref 5.0–8.0)

## 2017-02-22 LAB — COMPREHENSIVE METABOLIC PANEL
ALBUMIN: 3.9 g/dL (ref 3.5–5.0)
ALK PHOS: 57 U/L (ref 38–126)
ALT: 18 U/L (ref 14–54)
AST: 23 U/L (ref 15–41)
Anion gap: 12 (ref 5–15)
BUN: 10 mg/dL (ref 6–20)
CALCIUM: 9.2 mg/dL (ref 8.9–10.3)
CO2: 24 mmol/L (ref 22–32)
CREATININE: 0.71 mg/dL (ref 0.44–1.00)
Chloride: 103 mmol/L (ref 101–111)
GFR calc Af Amer: >60 mL/min (ref >60)
GFR calc non Af Amer: >60 mL/min (ref >60)
GLUCOSE: 153 mg/dL — AB (ref 65–99)
Potassium: 3.9 mmol/L (ref 3.5–5.1)
SODIUM: 139 mmol/L (ref 135–145)
Total Bilirubin: 0.3 mg/dL (ref 0.3–1.2)
Total Protein: 6.4 g/dL — ABNORMAL LOW (ref 6.5–8.1)

## 2017-02-22 LAB — I-STAT BETA HCG BLOOD, ED (MC, WL, AP ONLY)

## 2017-02-22 LAB — HIV ANTIBODY (ROUTINE TESTING W REFLEX): HIV SCREEN 4TH GENERATION: NONREACTIVE

## 2017-02-22 LAB — LIPASE, BLOOD: Lipase: 28 U/L (ref 11–51)

## 2017-02-22 SURGERY — APPENDECTOMY, LAPAROSCOPIC
Anesthesia: General | Site: Abdomen

## 2017-02-22 MED ORDER — PHENYLEPHRINE 40 MCG/ML (10ML) SYRINGE FOR IV PUSH (FOR BLOOD PRESSURE SUPPORT)
PREFILLED_SYRINGE | INTRAVENOUS | Status: DC | PRN
  Administered 2017-02-22: 40 ug via INTRAVENOUS
  Administered 2017-02-22: 80 ug via INTRAVENOUS
  Administered 2017-02-22: 40 ug via INTRAVENOUS

## 2017-02-22 MED ORDER — PROPOFOL 10 MG/ML IV BOLUS
INTRAVENOUS | Status: AC
  Filled 2017-02-22: qty 40

## 2017-02-22 MED ORDER — HYDRALAZINE HCL 20 MG/ML IJ SOLN: 10.0000 mg | INTRAMUSCULAR | Status: DC | PRN

## 2017-02-22 MED ORDER — MORPHINE SULFATE (PF) 4 MG/ML IV SOLN: 2.0000 mg | INTRAVENOUS | Status: DC | PRN

## 2017-02-22 MED ORDER — ROCURONIUM BROMIDE 10 MG/ML (PF) SYRINGE
PREFILLED_SYRINGE | INTRAVENOUS | Status: DC | PRN
  Administered 2017-02-22: 40 mg via INTRAVENOUS

## 2017-02-22 MED ORDER — ACETAMINOPHEN 325 MG PO TABS: 650.0000 mg | ORAL_TABLET | Freq: Four times a day (QID) | ORAL | Status: DC | PRN

## 2017-02-22 MED ORDER — MORPHINE SULFATE (PF) 4 MG/ML IV SOLN
4.0000 mg | Freq: Once | INTRAVENOUS | Status: AC
  Administered 2017-02-22: 4 mg via INTRAVENOUS
  Filled 2017-02-22: qty 1

## 2017-02-22 MED ORDER — PROPOFOL 10 MG/ML IV BOLUS
INTRAVENOUS | Status: DC | PRN
  Administered 2017-02-22: 170 mg via INTRAVENOUS

## 2017-02-22 MED ORDER — DEXTROSE 5 % IV SOLN
2.0000 g | Freq: Once | INTRAVENOUS | Status: AC
  Administered 2017-02-22: 2 g via INTRAVENOUS
  Filled 2017-02-22: qty 2

## 2017-02-22 MED ORDER — ONDANSETRON HCL 4 MG/2ML IJ SOLN: 4.0000 mg | Freq: Once | INTRAMUSCULAR | Status: DC

## 2017-02-22 MED ORDER — ONDANSETRON HCL 4 MG/2ML IJ SOLN
INTRAMUSCULAR | Status: DC | PRN
  Administered 2017-02-22: 4 mg via INTRAVENOUS

## 2017-02-22 MED ORDER — BUPIVACAINE HCL (PF) 0.25 % IJ SOLN
INTRAMUSCULAR | Status: DC | PRN
  Administered 2017-02-22: 6 mL

## 2017-02-22 MED ORDER — OXYCODONE HCL 5 MG PO TABS: 5.0000 mg | ORAL_TABLET | Freq: Once | ORAL | Status: DC | PRN

## 2017-02-22 MED ORDER — IOPAMIDOL (ISOVUE-300) INJECTION 61%
INTRAVENOUS | Status: AC
  Administered 2017-02-22: 100 mL
  Filled 2017-02-22: qty 100

## 2017-02-22 MED ORDER — FENTANYL CITRATE (PF) 250 MCG/5ML IJ SOLN
INTRAMUSCULAR | Status: AC
  Filled 2017-02-22: qty 5

## 2017-02-22 MED ORDER — SODIUM CHLORIDE 0.9 % IR SOLN
Status: DC | PRN
  Administered 2017-02-22: 1000 mL

## 2017-02-22 MED ORDER — DEXAMETHASONE SODIUM PHOSPHATE 10 MG/ML IJ SOLN
INTRAMUSCULAR | Status: AC
  Filled 2017-02-22: qty 1

## 2017-02-22 MED ORDER — HYDROCODONE-ACETAMINOPHEN 5-325 MG PO TABS: 1.0000 | ORAL_TABLET | ORAL | Status: DC | PRN

## 2017-02-22 MED ORDER — 0.9 % SODIUM CHLORIDE (POUR BTL) OPTIME
TOPICAL | Status: DC | PRN
  Administered 2017-02-22: 1000 mL

## 2017-02-22 MED ORDER — ACETAMINOPHEN 650 MG RE SUPP: 650.0000 mg | Freq: Four times a day (QID) | RECTAL | Status: DC | PRN

## 2017-02-22 MED ORDER — LIDOCAINE 2% (20 MG/ML) 5 ML SYRINGE
INTRAMUSCULAR | Status: AC
  Filled 2017-02-22: qty 5

## 2017-02-22 MED ORDER — FENTANYL CITRATE (PF) 100 MCG/2ML IJ SOLN
INTRAMUSCULAR | Status: DC | PRN
Start: 2017-02-22 — End: 2017-02-22
  Administered 2017-02-22: 50 ug via INTRAVENOUS
  Administered 2017-02-22: 100 ug via INTRAVENOUS

## 2017-02-22 MED ORDER — MORPHINE SULFATE (PF) 4 MG/ML IV SOLN
4.0000 mg | Freq: Once | INTRAVENOUS | Status: DC
  Filled 2017-02-22: qty 1

## 2017-02-22 MED ORDER — LACTATED RINGERS IV SOLN
INTRAVENOUS | Status: DC
  Administered 2017-02-22: 1 mL via INTRAVENOUS
  Administered 2017-02-22: 09:00:00 via INTRAVENOUS

## 2017-02-22 MED ORDER — KCL IN DEXTROSE-NACL 20-5-0.45 MEQ/L-%-% IV SOLN: INTRAVENOUS | Status: DC

## 2017-02-22 MED ORDER — MIDAZOLAM HCL 2 MG/2ML IJ SOLN
INTRAMUSCULAR | Status: AC
  Filled 2017-02-22: qty 2

## 2017-02-22 MED ORDER — SUCCINYLCHOLINE CHLORIDE 200 MG/10ML IV SOSY
PREFILLED_SYRINGE | INTRAVENOUS | Status: AC
  Filled 2017-02-22: qty 10

## 2017-02-22 MED ORDER — FENTANYL CITRATE (PF) 100 MCG/2ML IJ SOLN: 25.0000 ug | INTRAMUSCULAR | Status: DC | PRN

## 2017-02-22 MED ORDER — LIDOCAINE 2% (20 MG/ML) 5 ML SYRINGE
INTRAMUSCULAR | Status: DC | PRN
  Administered 2017-02-22: 40 mg via INTRAVENOUS

## 2017-02-22 MED ORDER — BUPIVACAINE HCL (PF) 0.25 % IJ SOLN
INTRAMUSCULAR | Status: AC
  Filled 2017-02-22: qty 30

## 2017-02-22 MED ORDER — MIDAZOLAM HCL 5 MG/5ML IJ SOLN
INTRAMUSCULAR | Status: DC | PRN
  Administered 2017-02-22: 2 mg via INTRAVENOUS

## 2017-02-22 MED ORDER — DIPHENHYDRAMINE HCL 50 MG/ML IJ SOLN: 25.0000 mg | Freq: Four times a day (QID) | INTRAMUSCULAR | Status: DC | PRN

## 2017-02-22 MED ORDER — SUGAMMADEX SODIUM 200 MG/2ML IV SOLN
INTRAVENOUS | Status: DC | PRN
  Administered 2017-02-22: 180 mg via INTRAVENOUS

## 2017-02-22 MED ORDER — DEXAMETHASONE SODIUM PHOSPHATE 10 MG/ML IJ SOLN
INTRAMUSCULAR | Status: DC | PRN
  Administered 2017-02-22: 10 mg via INTRAVENOUS

## 2017-02-22 MED ORDER — MORPHINE SULFATE (PF) 4 MG/ML IV SOLN: 1.0000 mg | INTRAVENOUS | Status: DC | PRN

## 2017-02-22 MED ORDER — OXYCODONE-ACETAMINOPHEN 5-325 MG PO TABS: 1.0000 | ORAL_TABLET | ORAL | Status: DC | PRN

## 2017-02-22 MED ORDER — METRONIDAZOLE IN NACL 5-0.79 MG/ML-% IV SOLN
500.0000 mg | Freq: Once | INTRAVENOUS | Status: AC
  Administered 2017-02-22: 500 mg via INTRAVENOUS
  Filled 2017-02-22: qty 100

## 2017-02-22 MED ORDER — ONDANSETRON HCL 4 MG/2ML IJ SOLN
4.0000 mg | Freq: Four times a day (QID) | INTRAMUSCULAR | Status: DC | PRN
  Administered 2017-02-22: 4 mg via INTRAVENOUS
  Filled 2017-02-22: qty 2

## 2017-02-22 MED ORDER — OXYCODONE HCL 5 MG/5ML PO SOLN: 5.0000 mg | Freq: Once | ORAL | Status: DC | PRN

## 2017-02-22 MED ORDER — ROCURONIUM BROMIDE 10 MG/ML (PF) SYRINGE
PREFILLED_SYRINGE | INTRAVENOUS | Status: AC
  Filled 2017-02-22: qty 5

## 2017-02-22 MED ORDER — ONDANSETRON HCL 4 MG/2ML IJ SOLN
4.0000 mg | Freq: Once | INTRAMUSCULAR | Status: AC
  Administered 2017-02-22: 4 mg via INTRAVENOUS
  Filled 2017-02-22: qty 2

## 2017-02-22 MED ORDER — IBUPROFEN 600 MG PO TABS: 600.0000 mg | ORAL_TABLET | Freq: Four times a day (QID) | ORAL | Status: DC | PRN

## 2017-02-22 MED ORDER — SODIUM CHLORIDE 0.9 % IV BOLUS (SEPSIS)
500.0000 mL | Freq: Once | INTRAVENOUS | Status: AC
  Administered 2017-02-22: 500 mL via INTRAVENOUS

## 2017-02-22 MED ORDER — ONDANSETRON HCL 4 MG/2ML IJ SOLN: 4.0000 mg | Freq: Once | INTRAMUSCULAR | Status: DC | PRN

## 2017-02-22 MED ORDER — SIMETHICONE 80 MG PO CHEW: 40.0000 mg | CHEWABLE_TABLET | Freq: Four times a day (QID) | ORAL | Status: DC | PRN

## 2017-02-22 MED ORDER — PHENYLEPHRINE 40 MCG/ML (10ML) SYRINGE FOR IV PUSH (FOR BLOOD PRESSURE SUPPORT)
PREFILLED_SYRINGE | INTRAVENOUS | Status: AC
  Filled 2017-02-22: qty 10

## 2017-02-22 MED ORDER — LACTATED RINGERS IV SOLN
INTRAVENOUS | Status: DC | PRN
  Administered 2017-02-22 (×2): via INTRAVENOUS

## 2017-02-22 MED ORDER — ONDANSETRON HCL 4 MG/2ML IJ SOLN
INTRAMUSCULAR | Status: AC
  Filled 2017-02-22: qty 2

## 2017-02-22 MED ORDER — ONDANSETRON 4 MG PO TBDP: 4.0000 mg | ORAL_TABLET | Freq: Four times a day (QID) | ORAL | Status: DC | PRN

## 2017-02-22 MED ORDER — SUCCINYLCHOLINE CHLORIDE 200 MG/10ML IV SOSY
PREFILLED_SYRINGE | INTRAVENOUS | Status: DC | PRN
  Administered 2017-02-22: 160 mg via INTRAVENOUS

## 2017-02-22 MED ORDER — DIPHENHYDRAMINE HCL 25 MG PO CAPS: 25.0000 mg | ORAL_CAPSULE | Freq: Four times a day (QID) | ORAL | Status: DC | PRN

## 2017-02-22 MED ORDER — SUGAMMADEX SODIUM 200 MG/2ML IV SOLN
INTRAVENOUS | Status: AC
  Filled 2017-02-22: qty 2

## 2017-02-22 SURGICAL SUPPLY — 45 items
APL SKNCLS STERI-STRIP NONHPOA (GAUZE/BANDAGES/DRESSINGS) ×1
APPLIER CLIP ROT 10 11.4 M/L (STAPLE)
APR CLP MED LRG 11.4X10 (STAPLE)
BAG SPEC RTRVL LRG 6X4 10 (ENDOMECHANICALS) ×1
BENZOIN TINCTURE PRP APPL 2/3 (GAUZE/BANDAGES/DRESSINGS) ×3 IMPLANT
BLADE CLIPPER SURG (BLADE) IMPLANT
CANISTER SUCT 3000ML PPV (MISCELLANEOUS) ×3 IMPLANT
CHLORAPREP W/TINT 26ML (MISCELLANEOUS) ×3 IMPLANT
CLIP APPLIE ROT 10 11.4 M/L (STAPLE) IMPLANT
CLOSURE WOUND 1/2 X4 (GAUZE/BANDAGES/DRESSINGS) ×1
COVER SURGICAL LIGHT HANDLE (MISCELLANEOUS) ×3 IMPLANT
CUTTER FLEX LINEAR 45M (STAPLE) ×3 IMPLANT
DRSG TEGADERM 2-3/8X2-3/4 SM (GAUZE/BANDAGES/DRESSINGS) ×6 IMPLANT
DRSG TEGADERM 4X4.75 (GAUZE/BANDAGES/DRESSINGS) ×3 IMPLANT
ELECT REM PT RETURN 9FT ADLT (ELECTROSURGICAL) ×3
ELECTRODE REM PT RTRN 9FT ADLT (ELECTROSURGICAL) ×1 IMPLANT
ENDOLOOP SUT PDS II  0 18 (SUTURE)
ENDOLOOP SUT PDS II 0 18 (SUTURE) IMPLANT
FILTER SMOKE EVAC LAPAROSHD (FILTER) ×3 IMPLANT
GAUZE SPONGE 2X2 8PLY STRL LF (GAUZE/BANDAGES/DRESSINGS) ×1 IMPLANT
GLOVE BIO SURGEON STRL SZ7 (GLOVE) ×3 IMPLANT
GLOVE BIOGEL PI IND STRL 7.5 (GLOVE) ×1 IMPLANT
GLOVE BIOGEL PI INDICATOR 7.5 (GLOVE) ×2
GOWN STRL REUS W/ TWL LRG LVL3 (GOWN DISPOSABLE) ×3 IMPLANT
GOWN STRL REUS W/TWL LRG LVL3 (GOWN DISPOSABLE) ×9
KIT BASIN OR (CUSTOM PROCEDURE TRAY) ×3 IMPLANT
KIT ROOM TURNOVER OR (KITS) ×3 IMPLANT
NS IRRIG 1000ML POUR BTL (IV SOLUTION) ×3 IMPLANT
PAD ARMBOARD 7.5X6 YLW CONV (MISCELLANEOUS) ×6 IMPLANT
POUCH SPECIMEN RETRIEVAL 10MM (ENDOMECHANICALS) ×3 IMPLANT
RELOAD STAPLE 45 3.5 BLU ETS (ENDOMECHANICALS) ×1 IMPLANT
RELOAD STAPLE TA45 3.5 REG BLU (ENDOMECHANICALS) ×3 IMPLANT
SET IRRIG TUBING LAPAROSCOPIC (IRRIGATION / IRRIGATOR) ×3 IMPLANT
SHEARS HARMONIC ACE PLUS 36CM (ENDOMECHANICALS) ×3 IMPLANT
SLEEVE ENDOPATH XCEL 5M (ENDOMECHANICALS) ×3 IMPLANT
SPECIMEN JAR SMALL (MISCELLANEOUS) ×3 IMPLANT
SPONGE GAUZE 2X2 STER 10/PKG (GAUZE/BANDAGES/DRESSINGS) ×2
STRIP CLOSURE SKIN 1/2X4 (GAUZE/BANDAGES/DRESSINGS) ×2 IMPLANT
SUT MNCRL AB 4-0 PS2 18 (SUTURE) ×3 IMPLANT
TOWEL OR 17X24 6PK STRL BLUE (TOWEL DISPOSABLE) ×3 IMPLANT
TOWEL OR 17X26 10 PK STRL BLUE (TOWEL DISPOSABLE) ×3 IMPLANT
TRAY LAPAROSCOPIC MC (CUSTOM PROCEDURE TRAY) ×3 IMPLANT
TROCAR XCEL BLUNT TIP 100MML (ENDOMECHANICALS) ×3 IMPLANT
TROCAR XCEL NON-BLD 5MMX100MML (ENDOMECHANICALS) ×3 IMPLANT
TUBING INSUFFLATION (TUBING) ×3 IMPLANT

## 2017-02-22 NOTE — ED Notes (Signed)
Went to administer morphine to pt however pt was sleeping.  Morphine placed back in pyxis.

## 2017-02-22 NOTE — Progress Notes (Signed)
Received pt from PACU. A&O x4, abd lap sites with small dressing dry and intact. Denies pain at this time.

## 2017-02-22 NOTE — Anesthesia Postprocedure Evaluation (Addendum)
Anesthesia Post Note  Patient: Anita Harrison  Procedure(s) Performed: Procedure(s) (LRB): APPENDECTOMY LAPAROSCOPIC (N/A)  Patient location during evaluation: PACU Anesthesia Type: General Level of consciousness: awake, awake and alert and oriented Pain management: pain level controlled Vital Signs Assessment: post-procedure vital signs reviewed and stable Respiratory status: spontaneous breathing and nonlabored ventilation Cardiovascular status: blood pressure returned to baseline Anesthetic complications: no       Last Vitals:  Vitals:   02/22/17 1215 02/22/17 1240  BP:  105/67  Pulse: 76 79  Resp: 13 13  Temp: 36.4 C 36.6 C    Last Pain:  Vitals:   02/22/17 1240  TempSrc: Oral  PainSc:                  Phinley Schall COKER

## 2017-02-22 NOTE — H&P (Signed)
Anita Harrison is an 36 y.o. female.   Chief Complaint: abdominal pain HPI: 36 yo female with 1 day of abdominal pain. Pain is worse in the right lower quadrant. It does not move. It is constant. She had diarrhea and vomiting earlier. She denies fevers or chills. She has had similar pains but nothing as severe as this.  Past Medical History:  Diagnosis Date  . Cyst of Bartholin gland or duct   . Pregnancy induced hypertension     Past Surgical History:  Procedure Laterality Date  . LAPAROSCOPIC TUBAL LIGATION Bilateral 12/10/2015   Procedure: LAPAROSCOPIC TUBAL LIGATION;  Surgeon: Donnamae Jude, MD;  Location: [place] [facility];  Service: Gynecology;  Laterality: Bilateral;  . NO PAST SURGERIES    . THERAPEUTIC ABORTION      No family history on file. Social History:  reports that she has never smoked. She has never used smokeless tobacco. She reports that she does not drink alcohol or use drugs.  Allergies: No Known Allergies   (Not in a hospital admission)  Results for orders placed or performed during the hospital encounter of 02/22/17 (from the past 48 hour(s))  Urinalysis, Routine w reflex microscopic     Status: Abnormal   Collection Time: 02/22/17 12:00 AM  Result Value Ref Range   Color, Urine YELLOW YELLOW   APPearance HAZY (A) CLEAR   Specific Gravity, Urine 1.017 1.005 - 1.030   pH 7.0 5.0 - 8.0   Glucose, UA NEGATIVE NEGATIVE mg/dL   Hgb urine dipstick NEGATIVE NEGATIVE   Bilirubin Urine NEGATIVE NEGATIVE   Ketones, ur NEGATIVE NEGATIVE mg/dL   Protein, ur NEGATIVE NEGATIVE mg/dL   Nitrite NEGATIVE NEGATIVE   Leukocytes, UA TRACE (A) NEGATIVE   RBC / HPF 0-5 0 - 5 RBC/hpf   WBC, UA 0-5 0 - 5 WBC/hpf   Bacteria, UA NONE SEEN NONE SEEN   Squamous Epithelial / LPF 0-5 (A) NONE SEEN  Lipase, blood     Status: None   Collection Time: 02/22/17 12:01 AM  Result Value Ref Range   Lipase 28 11 - 51 U/L  Comprehensive metabolic panel     Status: Abnormal   Collection Time:  02/22/17 12:01 AM  Result Value Ref Range   Sodium 139 135 - 145 mmol/L   Potassium 3.9 3.5 - 5.1 mmol/L   Chloride 103 101 - 111 mmol/L   CO2 24 22 - 32 mmol/L   Glucose, Bld 153 (H) 65 - 99 mg/dL   BUN 10 6 - 20 mg/dL   Creatinine, Ser 0.71 0.44 - 1.00 mg/dL   Calcium 9.2 8.9 - 10.3 mg/dL   Total Protein 6.4 (L) 6.5 - 8.1 g/dL   Albumin 3.9 3.5 - 5.0 g/dL   AST 23 15 - 41 U/L   ALT 18 14 - 54 U/L   Alkaline Phosphatase 57 38 - 126 U/L   Total Bilirubin 0.3 0.3 - 1.2 mg/dL   GFR calc non Af Amer >60 >60 mL/min   GFR calc Af Amer >60 >60 mL/min    Comment: (NOTE) The eGFR has been calculated using the CKD EPI equation. This calculation has not been validated in all clinical situations. eGFR's persistently <60 mL/min signify possible Chronic Kidney Disease.    Anion gap 12 5 - 15  CBC     Status: Abnormal   Collection Time: 02/22/17 12:01 AM  Result Value Ref Range   WBC 12.4 (H) 4.0 - 10.5 K/uL   RBC 4.26 3.87 -  5.11 MIL/uL   Hemoglobin 12.0 12.0 - 15.0 g/dL   HCT 36.1 36.0 - 46.0 %   MCV 84.7 78.0 - 100.0 fL   MCH 28.2 26.0 - 34.0 pg   MCHC 33.2 30.0 - 36.0 g/dL   RDW 13.8 11.5 - 15.5 %   Platelets 254 150 - 400 K/uL  I-Stat beta hCG blood, ED     Status: None   Collection Time: 02/22/17 12:24 AM  Result Value Ref Range   I-stat hCG, quantitative <5.0 <5 mIU/mL   Comment 3            Comment:   GEST. AGE      CONC.  (mIU/mL)   <=1 WEEK        5 - 50     2 WEEKS       50 - 500     3 WEEKS       100 - 10,000     4 WEEKS     1,000 - 30,000        FEMALE AND NON-PREGNANT FEMALE:     LESS THAN 5 mIU/mL    US Transvaginal Non-ob  Result Date: 02/22/2017 CLINICAL DATA:  Sudden onset right lower quadrant pain tonight EXAM: TRANSABDOMINAL AND TRANSVAGINAL ULTRASOUND OF PELVIS DOPPLER ULTRASOUND OF OVARIES TECHNIQUE: Both transabdominal and transvaginal ultrasound examinations of the pelvis were performed. Transabdominal technique was performed for global imaging of the  pelvis including uterus, ovaries, adnexal regions, and pelvic cul-de-sac. It was necessary to proceed with endovaginal exam following the transabdominal exam to visualize the ovaries. Color and duplex Doppler ultrasound was utilized to evaluate blood flow to the ovaries. COMPARISON:  CT 10/05/2012 FINDINGS: Uterus Measurements: 9.3 x 3.3 x 6.0 cm. No fibroids or other mass visualized. Endometrium Thickness: 1.0 mm.  No focal abnormality visualized. Right ovary Measurements: 3.0 x 1.6 x 1.3 cm. Normal appearance/no adnexal mass. Left ovary Measurements: 3.8 x 2.3 x 1.8 cm. Normal appearance/no adnexal mass. Pulsed Doppler evaluation of both ovaries demonstrates normal low-resistance arterial and venous waveforms. Other findings No abnormal free fluid. IMPRESSION: Normal uterus and ovaries. No abnormal fluid collections. Doppler confirms intact perfusion of the ovaries. Electronically Signed   By: Andreas Newport M.D.   On: 02/22/2017 02:51   US Pelvis Complete  Result Date: 02/22/2017 CLINICAL DATA:  Sudden onset right lower quadrant pain tonight EXAM: TRANSABDOMINAL AND TRANSVAGINAL ULTRASOUND OF PELVIS DOPPLER ULTRASOUND OF OVARIES TECHNIQUE: Both transabdominal and transvaginal ultrasound examinations of the pelvis were performed. Transabdominal technique was performed for global imaging of the pelvis including uterus, ovaries, adnexal regions, and pelvic cul-de-sac. It was necessary to proceed with endovaginal exam following the transabdominal exam to visualize the ovaries. Color and duplex Doppler ultrasound was utilized to evaluate blood flow to the ovaries. COMPARISON:  CT 10/05/2012 FINDINGS: Uterus Measurements: 9.3 x 3.3 x 6.0 cm. No fibroids or other mass visualized. Endometrium Thickness: 1.0 mm.  No focal abnormality visualized. Right ovary Measurements: 3.0 x 1.6 x 1.3 cm. Normal appearance/no adnexal mass. Left ovary Measurements: 3.8 x 2.3 x 1.8 cm. Normal appearance/no adnexal mass. Pulsed  Doppler evaluation of both ovaries demonstrates normal low-resistance arterial and venous waveforms. Other findings No abnormal free fluid. IMPRESSION: Normal uterus and ovaries. No abnormal fluid collections. Doppler confirms intact perfusion of the ovaries. Electronically Signed   By: Andreas Newport M.D.   On: 02/22/2017 02:51   Ct Abdomen Pelvis W Contrast  Result Date: 02/22/2017 CLINICAL DATA:  RIGHT lower quadrant  pain, nausea and diarrhea beginning at 2000 hours. EXAM: CT ABDOMEN AND PELVIS WITH CONTRAST TECHNIQUE: Multidetector CT imaging of the abdomen and pelvis was performed using the standard protocol following bolus administration of intravenous contrast. CONTRAST:  186m ISOVUE-300 IOPAMIDOL (ISOVUE-300) INJECTION 61% COMPARISON:  Pelvic ultrasound June 24, 2017 at 0223 hours and CT abdomen and pelvis October 05, 2012 FINDINGS: LOWER CHEST: Lung bases are clear. Included heart size is normal. No pericardial effusion. HEPATOBILIARY: Liver and gallbladder are normal. PANCREAS: Normal. SPLEEN: Normal. ADRENALS/URINARY TRACT: Kidneys are orthotopic, demonstrating symmetric enhancement. No nephrolithiasis, hydronephrosis or solid renal masses. The unopacified ureters are normal in course and caliber. Urinary bladder is partially distended and unremarkable. Normal adrenal glands. STOMACH/BOWEL: The stomach, small and large bowel are normal in course and caliber without inflammatory changes. 8 mm appendix with mural hyperemia and mild periappendiceal inflammation. No appendicolith. VASCULAR/LYMPHATIC: Aortoiliac vessels are normal in course and caliber. Re- demonstration of misty mesentery with mesenteric lymphadenopathy, largest measuring 16 mm, which previously measured 9 mm. REPRODUCTIVE: Normal.  Tubal ligation clips in situ. OTHER: No intraperitoneal free fluid or free air. MUSCULOSKELETAL: Nonacute. IMPRESSION: Very early acute appendicitis. Persistent mesenteric fat stranding with increasing  mesenteric lymphadenopathy suggesting chronic inflammation versus chronic mesenteric panniculitis, less likely neoplasm or infection such as tuberculosis. Acute findings discussed with and reconfirmed by Dr.EMILY WEST on 02/22/2017 at 3:50 am. Electronically Signed   By: CElon AlasM.D.   On: 02/22/2017 03:51   [place]/ven Flow Abd Pelv Doppler  Result Date: 02/22/2017 CLINICAL DATA:  Sudden onset right lower quadrant pain tonight EXAM: TRANSABDOMINAL AND TRANSVAGINAL ULTRASOUND OF PELVIS DOPPLER ULTRASOUND OF OVARIES TECHNIQUE: Both transabdominal and transvaginal ultrasound examinations of the pelvis were performed. Transabdominal technique was performed for global imaging of the pelvis including uterus, ovaries, adnexal regions, and pelvic cul-de-sac. It was necessary to proceed with endovaginal exam following the transabdominal exam to visualize the ovaries. Color and duplex Doppler ultrasound was utilized to evaluate blood flow to the ovaries. COMPARISON:  CT 10/05/2012 FINDINGS: Uterus Measurements: 9.3 x 3.3 x 6.0 cm. No fibroids or other mass visualized. Endometrium Thickness: 1.0 mm.  No focal abnormality visualized. Right ovary Measurements: 3.0 x 1.6 x 1.3 cm. Normal appearance/no adnexal mass. Left ovary Measurements: 3.8 x 2.3 x 1.8 cm. Normal appearance/no adnexal mass. Pulsed Doppler evaluation of both ovaries demonstrates normal low-resistance arterial and venous waveforms. Other findings No abnormal free fluid. IMPRESSION: Normal uterus and ovaries. No abnormal fluid collections. Doppler confirms intact perfusion of the ovaries. Electronically Signed   By: DAndreas NewportM.D.   On: 02/22/2017 02:51    Review of Systems  Constitutional: Negative for chills and fever.  HENT: Negative for hearing loss.   Eyes: Negative for blurred vision and double vision.  Respiratory: Negative for cough and hemoptysis.   Cardiovascular: Negative for chest pain and palpitations.  Gastrointestinal:  Positive for abdominal pain, diarrhea, nausea and vomiting.  Genitourinary: Negative for dysuria and urgency.  Musculoskeletal: Negative for myalgias and neck pain.  Skin: Negative for itching and rash.  Neurological: Negative for dizziness, tingling and headaches.  Endo/Heme/Allergies: Does not bruise/bleed easily.  Psychiatric/Behavioral: Negative for depression and suicidal ideas.    Blood pressure 116/72, pulse 83, temperature 98 F (36.7 C), temperature source Oral, resp. rate 20, height 5' 4"  (1.626 m), weight 90.7 kg (200 lb), last menstrual period 02/18/2017, SpO2 99 %. Physical Exam  Vitals reviewed. Constitutional: She is oriented to person, place, and time. She appears well-developed and  well-nourished.  HENT:  Head: Normocephalic and atraumatic.  Eyes: Conjunctivae and EOM are normal. Pupils are equal, round, and reactive to light.  Neck: Normal range of motion. Neck supple.  Cardiovascular: Normal rate and regular rhythm.   Respiratory: Effort normal and breath sounds normal.  GI: Soft. Bowel sounds are normal. She exhibits no distension. There is tenderness in the right lower quadrant. There is guarding.  Musculoskeletal: Normal range of motion.  Neurological: She is alert and oriented to person, place, and time.  Skin: Skin is warm and dry.  Psychiatric: She has a normal mood and affect. Her behavior is normal.     Assessment/Plan 36 yo female with abdominal pain and findings consistent with acute appendicitis -admit to surgery -IV abx -laparoscopic appendectomy today -we discussed the details of the procedure; that it would be done under general anesthesia, that we would attempt to do the procedure laparoscopically. That the appendix would be isolated from the large and small intestine and then ligated and removed. We discussed the reason for this is to avoid rupture and infection and resolve the pains. We discussed risks of infection, abscess, injury to intestines or  urinary structures, and need for open incision. She showed good understanding and wanted to proceed.  Mickeal Skinner, MD 02/22/2017, 6:42 AM

## 2017-02-22 NOTE — Progress Notes (Signed)
Patient ID: Anita Harrison, female   DOB: [birth year], 36 y.o.   MRN: [id number]  Met with patient and discussed planned surgery with her.  Plan laparoscopic appendectomy.  The surgical procedure has been discussed with the patient.  Potential risks, benefits, alternative treatments, and expected outcomes have been explained.  All of the patient's questions at this time have been answered.  The likelihood of reaching the patient's treatment goal is good.  The patient understand the proposed surgical procedure and wishes to proceed.  Imogene Burn. Georgette Dover, MD, [facility] Surgery  General/ Trauma Surgery  02/22/2017 8:25 AM

## 2017-02-22 NOTE — Op Note (Signed)
Appendectomy, Lap, Procedure Note  Indications: The patient presented with a one-day history of right-sided abdominal pain. A CT scan revealed findings consistent with acute appendicitis.  Pre-operative Diagnosis: Acute appendicitis without mention of peritonitis  Post-operative Diagnosis: Same  Surgeon: Ajla Mcgeachy K.   Assistants: none  Anesthesia: General endotracheal anesthesia  ASA Class: 2  Procedure Details  The patient was seen again in the Holding Room. The risks, benefits, complications, treatment options, and expected outcomes were discussed with the patient and/or family. The possibilities of reaction to medication, perforation of viscus, bleeding, recurrent infection, finding a normal appendix, the need for additional procedures, failure to diagnose a condition, and creating a complication requiring transfusion or operation were discussed. There was concurrence with the proposed plan and informed consent was obtained. The site of surgery was properly noted. The patient was taken to Operating Room, identified as Anita Harrison and the procedure verified as Appendectomy. A Time Out was held and the above information confirmed.  The patient was placed in the supine position and general anesthesia was induced.  The abdomen was prepped and draped in a sterile fashion. A one centimeter infraumbilical incision was made.  Dissection was carried down to the fascia bluntly.  The fascia was incised vertically.  We entered the peritoneal cavity bluntly.  A pursestring suture was passed around the incision with a 0 Vicryl.  The Hasson cannula was introduced into the abdomen and the tails of the suture were used to hold the Hasson in place.   The pneumoperitoneum was then established maintaining a maximum pressure of 15 mmHg.  Additional 5 mm cannulas then placed in the left lower quadrant of the abdomen and the right upper quadrant under direct visualization. A careful evaluation of the  entire abdomen was carried out. The patient was placed in Trendelenburg and left lateral decubitus position.  The scope was moved to the right upper quadrant port site. The cecum was mobilized medially.  The appendix was identified and was mildly inflamed.  There was no sign of perforation.  The appendix was carefully dissected. The appendix was was skeletonized with the harmonic scalpel.   The appendix was divided at its base using an endo-GIA stapler. Minimal appendiceal stump was left in place. There was some oozing at the staple line that was controlled with harmonic scalpel.  There was no evidence of leakage, or complication after division of the appendix. Irrigation was also performed and irrigate suctioned from the abdomen as well.  The umbilical port site was closed with the purse string suture. There was no residual palpable fascial defect.  The trocar site skin wounds were closed with 4-0 Monocryl.  Instrument, sponge, and needle counts were correct at the conclusion of the case.   Findings: The appendix was found to be inflamed. There were not signs of necrosis.  There was not perforation. There was not abscess formation.  Estimated Blood Loss:  less than 50 mL         Drains: none         Specimens: appendix         Complications:  None; patient tolerated the procedure well.         Disposition: PACU - hemodynamically stable.         Condition: stable  Anita Arms. Corliss Skains, MD, [facility] Surgery  General/ Trauma Surgery  02/22/2017 11:29 AM

## 2017-02-22 NOTE — ED Notes (Signed)
Patient transported to CT 

## 2017-02-22 NOTE — Anesthesia Preprocedure Evaluation (Addendum)
Anesthesia Evaluation  Patient identified by MRN, date of birth, ID band Patient awake    Reviewed: Allergy & Precautions, NPO status , Patient's Chart, lab work & pertinent test results  Airway Mallampati: II  TM Distance: >3 FB Neck ROM: Full    Dental  (+) Dental Advisory Given, Teeth Intact   Pulmonary    breath sounds clear to auscultation       Cardiovascular hypertension,  Rhythm:Regular Rate:Normal     Neuro/Psych    GI/Hepatic   Endo/Other  Morbid obesity  Renal/GU      Musculoskeletal   Abdominal   Peds  Hematology   Anesthesia Other Findings   Reproductive/Obstetrics                            Anesthesia Physical Anesthesia Plan  ASA: II  Anesthesia Plan: General   Post-op Pain Management:    Induction: Intravenous  Airway Management Planned: Oral ETT  Additional Equipment:   Intra-op Plan:   Post-operative Plan: Extubation in OR  Informed Consent: I have reviewed the patients History and Physical, chart, labs and discussed the procedure including the risks, benefits and alternatives for the proposed anesthesia with the patient or authorized representative who has indicated his/her understanding and acceptance.   Dental advisory given  Plan Discussed with: CRNA and Anesthesiologist  Anesthesia Plan Comments:         Anesthesia Quick Evaluation
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
## 2017-02-22 NOTE — Transfer of Care (Signed)
Immediate Anesthesia Transfer of Care Note  Patient: Anita Harrison  Procedure(s) Performed: Procedure(s): APPENDECTOMY LAPAROSCOPIC (N/A)  Patient Location: PACU  Anesthesia Type:General  Level of Consciousness: awake, alert  and oriented  Airway & Oxygen Therapy: Patient Spontanous Breathing and Patient connected to nasal cannula oxygen  Post-op Assessment: Report given to RN and Post -op Vital signs reviewed and stable  Post vital signs: Reviewed and stable  Last Vitals:  Vitals:   02/22/17 0745 02/22/17 1130  BP: 103/66   Pulse: 65   Resp:    Temp:  (P) 36.3 C    Last Pain:  Vitals:   02/22/17 0903  TempSrc:   PainSc: 3       Patients Stated Pain Goal: 3 (02/22/17 0313)  Complications: No apparent anesthesia complications

## 2017-02-22 NOTE — ED Notes (Addendum)
Dr. Tsuei at bedside.  

## 2017-02-22 NOTE — Anesthesia Procedure Notes (Signed)
Procedure Name: Intubation Date/Time: 02/22/2017 10:33 AM Performed by: Rise Patience T Pre-anesthesia Checklist: Patient identified, Emergency Drugs available, Suction available and Patient being monitored Patient Re-evaluated:Patient Re-evaluated prior to inductionOxygen Delivery Method: Circle System Utilized Preoxygenation: Pre-oxygenation with 100% oxygen Intubation Type: IV induction, Rapid sequence and Cricoid Pressure applied Laryngoscope Size: Miller and 2 Grade View: Grade I Tube type: Oral Tube size: 7.5 mm Number of attempts: 1 Airway Equipment and Method: Stylet and Oral airway Placement Confirmation: ETT inserted through vocal cords under direct vision,  positive ETCO2 and breath sounds checked- equal and bilateral Secured at: 22 cm Tube secured with: Tape Dental Injury: Teeth and Oropharynx as per pre-operative assessment

## 2017-02-22 NOTE — ED Notes (Signed)
Patient went to Ultrasound.

## 2017-02-23 ENCOUNTER — Encounter: Payer: Self-pay | Admitting: General Surgery

## 2017-02-23 ENCOUNTER — Encounter (HOSPITAL_COMMUNITY): Payer: Self-pay | Admitting: Surgery

## 2017-02-23 LAB — CBC
HEMATOCRIT: 36 % (ref 36.0–46.0)
Hemoglobin: 11.6 g/dL — ABNORMAL LOW (ref 12.0–15.0)
MCH: 27.6 pg (ref 26.0–34.0)
MCHC: 32.2 g/dL (ref 30.0–36.0)
MCV: 85.5 fL (ref 78.0–100.0)
Platelets: 278 10*3/uL (ref 150–400)
RBC: 4.21 MIL/uL (ref 3.87–5.11)
RDW: 13.7 % (ref 11.5–15.5)
WBC: 18 10*3/uL — AB (ref 4.0–10.5)

## 2017-02-23 MED ORDER — ACETAMINOPHEN 325 MG PO TABS: ORAL_TABLET | ORAL | Status: AC

## 2017-02-23 MED ORDER — IBUPROFEN 200 MG PO TABS: ORAL_TABLET | ORAL | Status: AC

## 2017-02-23 MED ORDER — OXYCODONE-ACETAMINOPHEN 5-325 MG PO TABS: 1.0000 | ORAL_TABLET | ORAL | 0 refills | Status: AC | PRN

## 2017-02-23 NOTE — Discharge Summary (Signed)
Physician Discharge Summary  Patient ID: Anita Harrison MRN: [id number] DOB/AGE: 36-12-1980 36 y.o. Chief complaint:  abdominal pain  Admit date: 02/22/2017 Discharge date: 02/23/2017  Admission Diagnoses:  Acute appendicitis  Discharge Diagnoses:  Acute appendicitis  Active Problems:   Acute appendicitis   PROCEDURES: Laparoscopic appendectomy 02/22/17, Dr. Manus Rudd.  Hospital Course:  37 yo female with 1 day of abdominal pain. Pain is worse in the right lower quadrant. It does not move. It is constant. She had diarrhea and vomiting earlier. She denies fevers or chills. She has had similar pains but nothing as severe as this. Patient was seen in the emergency department and admitted by Dr. Feliciana Rossetti. She was seen later that a.m. by Dr. Corliss Skains and taken to the operating room. Postoperatively she has done well from her appendectomy. She is tolerating diet well. First postop morning exam: 36 yo female with 1 day of abdominal pain. Pain is worse in the right lower quadrant. It does not move. It is constant. She had diarrhea and vomiting earlier. She denies fevers or chills. She has had similar pains but nothing as severe as this. General appearance: alert, cooperative and no distress Resp: clear to auscultation bilaterally GI: Soft, sore, sites all look fine.   Patient is doing well and is ready for discharge.  CBC Latest Ref Rng & Units 02/23/2017 02/22/2017 01/08/2016  WBC 4.0 - 10.5 K/uL 18.0(H) 12.4(H) 9.0  Hemoglobin 12.0 - 15.0 g/dL 11.6(L) 12.0 13.2  Hematocrit 36.0 - 46.0 % 36.0 36.1 39.4  Platelets 150 - 400 K/uL 278 254 246   CMP Latest Ref Rng & Units 02/22/2017 01/08/2016 10/05/2012  Glucose 65 - 99 mg/dL 409(W) 98 119(J)  BUN 6 - 20 mg/dL Creatinine 0.44 - 1.00 mg/dL 4.78 2.95 6.21(H)  Sodium 135 - 145 mmol/L 139 141 139  Potassium 3.5 - 5.1 mmol/L 3.9 4.4 5.0  Chloride 101 - 111 mmol/L 103 108 103  CO2 22 - 32 mmol/L Calcium 8.9 - 10.3  mg/dL 9.2 9.6 9.5  Total Protein 6.5 - 8.1 g/dL 6.4(L) 6.9 -  Total Bilirubin 0.3 - 1.2 mg/dL 0.3 0.5 -  Alkaline Phos 38 - 126 U/L 57 68 -  AST 15 - 41 U/L 23 24 -  ALT 14 - 54 U/L 18 25 -   Condition on discharge: Improved  Disposition: 01-Home or Self Care   Allergies as of 02/23/2017   No Known Allergies     Medication List    TAKE these medications   acetaminophen 325 MG tablet Commonly known as:  TYLENOL You can use plain Tylenol or ibuprofen as your first line treatment for pain. You can alternate Tylenol with ibuprofen also for pain control. Do not take more than 4000 mg of Tylenol per day. Your prescribed pain medication has Tylenol in it. You must count each one of your prescribed pain medication tablets as a Tylenol.   ibuprofen 200 MG tablet Commonly known as:  ADVIL,MOTRIN You can safely take 2-3 tablets every 6 hours as needed for pain. You can use this as a first line treatment for pain. Can alternate it with plain Tylenol. If this is inadequate you can take a prescribed pain medication as your second line of treatment for pain.   oxyCODONE-acetaminophen 5-325 MG tablet Commonly known as:  PERCOCET/ROXICET Take 1-2 tablets by mouth every 4 (four) hours as needed for moderate pain.      Follow-up Information  [facility] Follow up on 03/24/2017.   Specialty:  General Surgery Why:  Your appointment is at 10:15 AM, be at the office 30 minutes early for check-in. Bring your photo ID and insurance information. Contact information: [address] [place] [address] [phone]           Signed: Sherrie George 02/23/2017, 11:13 AM

## 2017-02-23 NOTE — Discharge Instructions (Signed)
Laparoscopic Appendectomy, Adult, Care After °These instructions give you information about caring for yourself after your procedure. Your doctor may also give you more specific instructions. Call your doctor if you have any problems or questions after your procedure. °Follow these instructions at home: °Medicines  °· Take over-the-counter and prescription medicines only as told by your doctor. °· Do not drive for 24 hours if you received a sedative. °· Do not drive or use heavy machinery while taking prescription pain medicine. °· If you were prescribed an antibiotic medicine, take it as told by your doctor. Do not stop taking it even if you start to feel better. °Activity  °· Do not lift anything that is heavier than 10 pounds (4.5 kg) for 3 weeks or as told by your doctor. °· Do not play contact sports for 3 weeks or as told by your doctor. °· Slowly return to your normal activities. °Bathing  °· Keep your cuts from surgery (incisions) clean and dry. °¨ Gently wash the cuts with soap and water. °¨ Rinse the cuts with water until the soap is gone. °¨ Pat the cuts dry with a clean towel. Do not rub the cuts. °· You may take showers after 48 hours. °· Do not take baths, swim, or use a hot tub for 2 weeks or as told by your doctor. °Cut Care  °· Follow instructions from your doctor about how to take care of your cuts. Make sure you: °¨ Wash your hands with soap and water before you change your bandage (dressing). If you do not have soap and water, use hand sanitizer. °¨ Change your bandage as told by your doctor. °¨ Leave stitches (sutures), skin glue, or skin tape (adhesive) strips in place. They may need to stay in place for 2 weeks or longer. If tape strips get loose and curl up, you may trim the loose edges. Do not remove tape strips completely unless your doctor says it is okay. °· Check your cuts every day for signs of infection. Check for: °¨ More redness, swelling, or pain. °¨ More fluid or  blood. °¨ Warmth. °¨ Pus or a bad smell. °Other Instructions  °· If you were sent home with a drain, follow instructions from your doctor about how to use it and care for it. °· Take deep breaths. This helps to keep your lungs from getting swollen (inflamed). °· To help with constipation: °¨ Drink plenty of fluids. °¨ Eat plenty of fruits and vegetables. °· Keep all follow-up visits as told by your doctor. This is important. °Contact a doctor if: °· You have more redness, swelling, or pain around a cut from surgery. °· You have more fluid or blood coming from a cut. °· Your cut feels warm to the touch. °· You have pus or a bad smell coming from a cut or a bandage. °· The edges of a cut break open after the stitches have been taken out. °· You have pain in your shoulders that gets worse. °· You feel dizzy or you pass out (faint). °· You have shortness of breath. °· You keep feeling sick to your stomach (nauseous). °· You keep throwing up (vomiting). °· You get diarrhea or you cannot control your poop. °· You lose your appetite. °· You have swelling or pain in your legs. °Get help right away if: °· You have a fever. °· You get a rash. °· You have trouble breathing. °· You have sharp pains in your chest. °This information is not   intended to replace advice given to you by your health care provider. Make sure you discuss any questions you have with your health care provider. °Document Released: 08/08/2009 Document Revised: 03/19/2016 Document Reviewed: 04/01/2015 °Elsevier Interactive Patient Education © 2017 [employer] ° °CCS ______[facility] °LAPAROSCOPIC SURGERY: POST OP INSTRUCTIONS °Always review your discharge instruction sheet given to you by the facility where your surgery was performed. °IF YOU HAVE DISABILITY OR FAMILY LEAVE FORMS, YOU MUST BRING THEM TO THE OFFICE FOR PROCESSING.   °DO NOT GIVE THEM TO YOUR DOCTOR. ° °1. A prescription for pain medication may be given to you upon  discharge.  Take your pain medication as prescribed, if needed.  If narcotic pain medicine is not needed, then you may take acetaminophen (Tylenol) or ibuprofen (Advil) as needed. °2. Take your usually prescribed medications unless otherwise directed. °3. If you need a refill on your pain medication, please contact your pharmacy.  They will contact our office to request authorization. Prescriptions will not be filled after 5pm or on week-ends. °4. You should follow a light diet the first few days after arrival home, such as soup and crackers, etc.  Be sure to include lots of fluids daily. °5. Most patients will experience some swelling and bruising in the area of the incisions.  Ice packs will help.  Swelling and bruising can take several days to resolve.  °6. It is common to experience some constipation if taking pain medication after surgery.  Increasing fluid intake and taking a stool softener (such as Colace) will usually help or prevent this problem from occurring.  A mild laxative (Milk of Magnesia or Miralax) should be taken according to package instructions if there are no bowel movements after 48 hours. °7. Unless discharge instructions indicate otherwise, you may remove your bandages 24-48 hours after surgery, and you may shower at that time.  You may have steri-strips (small skin tapes) in place directly over the incision.  These strips should be left on the skin for 7-10 days.  If your surgeon used skin glue on the incision, you may shower in 24 hours.  The glue will flake off over the next 2-3 weeks.  Any sutures or staples will be removed at the office during your follow-up visit. °8. ACTIVITIES:  You may resume regular (light) daily activities beginning the next day--such as daily self-care, walking, climbing stairs--gradually increasing activities as tolerated.  You may have sexual intercourse when it is comfortable.  Refrain from any heavy lifting or straining until approved by your doctor. °a. You  may drive when you are no longer taking prescription pain medication, you can comfortably wear a seatbelt, and you can safely maneuver your car and apply brakes. °b. RETURN TO WORK:  __________________________________________________________ °9. You should see your doctor in the office for a follow-up appointment approximately 2-3 weeks after your surgery.  Make sure that you call for this appointment within a day or two after you arrive home to insure a convenient appointment time. °10. OTHER INSTRUCTIONS: __________________________________________________________________________________________________________________________ __________________________________________________________________________________________________________________________ °WHEN TO CALL YOUR DOCTOR: °1. Fever over 101.0 °2. Inability to urinate °3. Continued bleeding from incision. °4. Increased pain, redness, or drainage from the incision. °5. Increasing abdominal pain ° °The clinic staff is available to answer your questions during regular business hours.  Please don’t hesitate to call and ask to speak to one of the nurses for clinical concerns.  If you have a medical emergency, go to the nearest emergency room or call   911.  A surgeon from [facility] is always on call at the hospital. °[address], [address], [place], [place]  [address] ? [address] [address], [place], [place]   [address] °[phone] ? [phone] ? FAX [phone] °Web site: [email or web address] ° °

## 2017-02-23 NOTE — Progress Notes (Signed)
Pt discharged home in stable condition after going over discharge instructions with no concerns voiced. AVS and discharge script given before leaving the unit

## 2017-02-23 NOTE — Progress Notes (Signed)
Cc: Abdominal pain 1 Day Post-Op  Subjective: She looks great plan discharge today  Objective: Vital signs in last 24 hours: Temp:  [97.3 F (36.3 C)-98.1 F (36.7 C)] 98.1 F (36.7 C) (05/01 0452) Pulse Rate:  [66-99] 99 (05/01 0452) Resp:  [12-19] 19 (05/01 0452) BP: (101-115)/(58-67) 101/59 (05/01 0452) SpO2:  [94 %-100 %] 97 % (05/01 0452) Last BM Date: 02/21/17 240 by mouth 2600 IV Voided 4 Afebrile vital signs are stable WBC 18,000 this a.m.    Intake/Output from previous day: 04/30 0701 - 05/01 0700 In: 2799.2 [P.O.:240; I.V.:2559.2] Out: 5 [Blood:5] Intake/Output this shift: Total I/O In: 360 [P.O.:360] Out: -   General appearance: alert, cooperative and no distress Resp: clear to auscultation bilaterally GI: Soft, sore, sites all look fine.  Lab Results:   Recent Labs  02/22/17 0001 02/23/17 0341  WBC 12.4* 18.0*  HGB 12.0 11.6*  HCT 36.1 36.0  PLT 254 278    BMET  Recent Labs  02/22/17 0001  NA 139  K 3.9  CL 103  CO2 24  GLUCOSE 153*  BUN 10  CREATININE 0.71  CALCIUM 9.2   PT/INR No results for input(s): LABPROT, INR in the last 72 hours.   Recent Labs Lab 02/22/17 0001  AST 23  ALT 18  ALKPHOS 57  BILITOT 0.3  PROT 6.4*  ALBUMIN 3.9     Lipase     Component Value Date/Time   LIPASE 28 02/22/2017 0001     Studies/Results: US Transvaginal Non-ob  Result Date: 02/22/2017 CLINICAL DATA:  Sudden onset right lower quadrant pain tonight EXAM: TRANSABDOMINAL AND TRANSVAGINAL ULTRASOUND OF PELVIS DOPPLER ULTRASOUND OF OVARIES TECHNIQUE: Both transabdominal and transvaginal ultrasound examinations of the pelvis were performed. Transabdominal technique was performed for global imaging of the pelvis including uterus, ovaries, adnexal regions, and pelvic cul-de-sac. It was necessary to proceed with endovaginal exam following the transabdominal exam to visualize the ovaries. Color and duplex Doppler ultrasound was utilized to  evaluate blood flow to the ovaries. COMPARISON:  CT 10/05/2012 FINDINGS: Uterus Measurements: 9.3 x 3.3 x 6.0 cm. No fibroids or other mass visualized. Endometrium Thickness: 1.0 mm.  No focal abnormality visualized. Right ovary Measurements: 3.0 x 1.6 x 1.3 cm. Normal appearance/no adnexal mass. Left ovary Measurements: 3.8 x 2.3 x 1.8 cm. Normal appearance/no adnexal mass. Pulsed Doppler evaluation of both ovaries demonstrates normal low-resistance arterial and venous waveforms. Other findings No abnormal free fluid. IMPRESSION: Normal uterus and ovaries. No abnormal fluid collections. Doppler confirms intact perfusion of the ovaries. Electronically Signed   By: Ellery Plunk M.D.   On: 02/22/2017 02:51   US Pelvis Complete  Result Date: 02/22/2017 CLINICAL DATA:  Sudden onset right lower quadrant pain tonight EXAM: TRANSABDOMINAL AND TRANSVAGINAL ULTRASOUND OF PELVIS DOPPLER ULTRASOUND OF OVARIES TECHNIQUE: Both transabdominal and transvaginal ultrasound examinations of the pelvis were performed. Transabdominal technique was performed for global imaging of the pelvis including uterus, ovaries, adnexal regions, and pelvic cul-de-sac. It was necessary to proceed with endovaginal exam following the transabdominal exam to visualize the ovaries. Color and duplex Doppler ultrasound was utilized to evaluate blood flow to the ovaries. COMPARISON:  CT 10/05/2012 FINDINGS: Uterus Measurements: 9.3 x 3.3 x 6.0 cm. No fibroids or other mass visualized. Endometrium Thickness: 1.0 mm.  No focal abnormality visualized. Right ovary Measurements: 3.0 x 1.6 x 1.3 cm. Normal appearance/no adnexal mass. Left ovary Measurements: 3.8 x 2.3 x 1.8 cm. Normal appearance/no adnexal mass. Pulsed Doppler evaluation of both ovaries  demonstrates normal low-resistance arterial and venous waveforms. Other findings No abnormal free fluid. IMPRESSION: Normal uterus and ovaries. No abnormal fluid collections. Doppler confirms intact  perfusion of the ovaries. Electronically Signed   By: Ellery Plunk M.D.   On: 02/22/2017 02:51   Ct Abdomen Pelvis W Contrast  Result Date: 02/22/2017 CLINICAL DATA:  RIGHT lower quadrant pain, nausea and diarrhea beginning at 2000 hours. EXAM: CT ABDOMEN AND PELVIS WITH CONTRAST TECHNIQUE: Multidetector CT imaging of the abdomen and pelvis was performed using the standard protocol following bolus administration of intravenous contrast. CONTRAST:  ISOVUE-300 IOPAMIDOL (ISOVUE-300) INJECTION 61% COMPARISON:  Pelvic ultrasound June 24, 2017 at 0223 hours and CT abdomen and pelvis October 05, 2012 FINDINGS: LOWER CHEST: Lung bases are clear. Included heart size is normal. No pericardial effusion. HEPATOBILIARY: Liver and gallbladder are normal. PANCREAS: Normal. SPLEEN: Normal. ADRENALS/URINARY TRACT: Kidneys are orthotopic, demonstrating symmetric enhancement. No nephrolithiasis, hydronephrosis or solid renal masses. The unopacified ureters are normal in course and caliber. Urinary bladder is partially distended and unremarkable. Normal adrenal glands. STOMACH/BOWEL: The stomach, small and large bowel are normal in course and caliber without inflammatory changes. 8 mm appendix with mural hyperemia and mild periappendiceal inflammation. No appendicolith. VASCULAR/LYMPHATIC: Aortoiliac vessels are normal in course and caliber. Re- demonstration of misty mesentery with mesenteric lymphadenopathy, largest measuring 16 mm, which previously measured 9 mm. REPRODUCTIVE: Normal.  Tubal ligation clips in situ. OTHER: No intraperitoneal free fluid or free air. MUSCULOSKELETAL: Nonacute. IMPRESSION: Very early acute appendicitis. Persistent mesenteric fat stranding with increasing mesenteric lymphadenopathy suggesting chronic inflammation versus chronic mesenteric panniculitis, less likely neoplasm or infection such as tuberculosis. Acute findings discussed with and reconfirmed by Dr.EMILY WEST on 02/22/2017 at  3:50 am. Electronically Signed   By: Awilda Metro M.D.   On: 02/22/2017 03:51   [place] Art/ven Flow Abd Pelv Doppler  Result Date: 02/22/2017 CLINICAL DATA:  Sudden onset right lower quadrant pain tonight EXAM: TRANSABDOMINAL AND TRANSVAGINAL ULTRASOUND OF PELVIS DOPPLER ULTRASOUND OF OVARIES TECHNIQUE: Both transabdominal and transvaginal ultrasound examinations of the pelvis were performed. Transabdominal technique was performed for global imaging of the pelvis including uterus, ovaries, adnexal regions, and pelvic cul-de-sac. It was necessary to proceed with endovaginal exam following the transabdominal exam to visualize the ovaries. Color and duplex Doppler ultrasound was utilized to evaluate blood flow to the ovaries. COMPARISON:  CT 10/05/2012 FINDINGS: Uterus Measurements: 9.3 x 3.3 x 6.0 cm. No fibroids or other mass visualized. Endometrium Thickness: 1.0 mm.  No focal abnormality visualized. Right ovary Measurements: 3.0 x 1.6 x 1.3 cm. Normal appearance/no adnexal mass. Left ovary Measurements: 3.8 x 2.3 x 1.8 cm. Normal appearance/no adnexal mass. Pulsed Doppler evaluation of both ovaries demonstrates normal low-resistance arterial and venous waveforms. Other findings No abnormal free fluid. IMPRESSION: Normal uterus and ovaries. No abnormal fluid collections. Doppler confirms intact perfusion of the ovaries. Electronically Signed   By: Ellery Plunk M.D.   On: 02/22/2017 02:51   Prior to Admission medications   Not on File    Medications: . ondansetron (ZOFRAN) IV  4 mg Intravenous Once   . lactated ringers 1 mL (02/22/17 2127)   Assessment/Plan Acute appendicitis Status post laparoscopic appendectomy 02/22/17 Dr. Manus Rudd. FEN:  Regular diet ID: Preop Flagyl and Rocephin only DVT: SCD  Looks good home today.  I have personally reviewed the patients medication history on the [place] controlled substance database.      LOS: 0 days     Jaycelyn Orrison 02/23/2017 [phone]

## 2017-03-11 ENCOUNTER — Telehealth: Payer: Self-pay | Admitting: *Deleted

## 2017-03-11 NOTE — Telephone Encounter (Signed)
-----   Message from Gerome ApleyLinda L Zeyfang, RN sent at 03/10/2017 10:18 AM EDT ----- Regarding: FW: call from pt Baxter HireKristen This is a patient in [facility] office- please call her.  Bonita QuinLinda ----- Message ----- From: Lanney GinsFoster, Suzanne D, CMA Sent: 03/04/2017   9:28 AM To: [facility] Clinical Pool Subject: call from pt                                   Call received from pt. Unable to determine needs, ?labs, due to pt accent. Pt has not been seen in our office.  Please contact pt.

## 2017-03-11 NOTE — Telephone Encounter (Signed)
Called pt, no answer, left message to call the office.  

## 2017-06-07 ENCOUNTER — Encounter (HOSPITAL_COMMUNITY): Payer: Self-pay | Admitting: Emergency Medicine

## 2017-06-07 ENCOUNTER — Emergency Department (HOSPITAL_COMMUNITY): Payer: Medicaid Other

## 2017-06-07 ENCOUNTER — Emergency Department (HOSPITAL_COMMUNITY)
Admission: EM | Admit: 2017-06-07 | Discharge: 2017-06-07 | Disposition: A | Discharge status: Home / Self Care | Payer: Medicaid Other | Attending: Emergency Medicine | Admitting: Emergency Medicine

## 2017-06-07 DIAGNOSIS — Z79899 Other long term (current) drug therapy: Secondary | ICD-10-CM | POA: Insufficient documentation

## 2017-06-07 DIAGNOSIS — Z791 Long term (current) use of non-steroidal anti-inflammatories (NSAID): Secondary | ICD-10-CM | POA: Insufficient documentation

## 2017-06-07 DIAGNOSIS — R1031 Right lower quadrant pain: Secondary | ICD-10-CM | POA: Diagnosis present

## 2017-06-07 DIAGNOSIS — N390 Urinary tract infection, site not specified: Secondary | ICD-10-CM | POA: Insufficient documentation

## 2017-06-07 LAB — CBC WITH DIFFERENTIAL/PLATELET
Basophils Absolute: 0 10*3/uL (ref 0.0–0.1)
Basophils Relative: 0 %
Eosinophils Absolute: 0.2 10*3/uL (ref 0.0–0.7)
Eosinophils Relative: 3 %
HCT: 37.4 % (ref 36.0–46.0)
Hemoglobin: 12.5 g/dL (ref 12.0–15.0)
Lymphocytes Relative: 22 %
Lymphs Abs: 1.9 10*3/uL (ref 0.7–4.0)
MCH: 28.2 pg (ref 26.0–34.0)
MCHC: 33.4 g/dL (ref 30.0–36.0)
MCV: 84.2 fL (ref 78.0–100.0)
Monocytes Absolute: 0.6 10*3/uL (ref 0.1–1.0)
Monocytes Relative: 7 %
Neutro Abs: 5.6 10*3/uL (ref 1.7–7.7)
Neutrophils Relative %: 68 %
Platelets: 260 10*3/uL (ref 150–400)
RBC: 4.44 MIL/uL (ref 3.87–5.11)
RDW: 13.5 % (ref 11.5–15.5)
WBC: 8.3 10*3/uL (ref 4.0–10.5)

## 2017-06-07 LAB — URINALYSIS, ROUTINE W REFLEX MICROSCOPIC
BACTERIA UA: NONE SEEN
BILIRUBIN URINE: NEGATIVE
Glucose, UA: NEGATIVE mg/dL
Hgb urine dipstick: NEGATIVE
Ketones, ur: NEGATIVE mg/dL
NITRITE: NEGATIVE
Protein, ur: NEGATIVE mg/dL
SPECIFIC GRAVITY, URINE: 1.013 (ref 1.005–1.030)
pH: 5 (ref 5.0–8.0)

## 2017-06-07 LAB — COMPREHENSIVE METABOLIC PANEL
ALBUMIN: 3.8 g/dL (ref 3.5–5.0)
ALK PHOS: 59 U/L (ref 38–126)
ALT: 20 U/L (ref 14–54)
ANION GAP: 6 (ref 5–15)
AST: 20 U/L (ref 15–41)
BUN: 7 mg/dL (ref 6–20)
CALCIUM: 8.8 mg/dL — AB (ref 8.9–10.3)
CO2: 27 mmol/L (ref 22–32)
CREATININE: 0.49 mg/dL (ref 0.44–1.00)
Chloride: 107 mmol/L (ref 101–111)
GFR calc Af Amer: >60 mL/min (ref >60)
GFR calc non Af Amer: >60 mL/min (ref >60)
GLUCOSE: 84 mg/dL (ref 65–99)
Potassium: 4.2 mmol/L (ref 3.5–5.1)
SODIUM: 140 mmol/L (ref 135–145)
TOTAL PROTEIN: 6.3 g/dL — AB (ref 6.5–8.1)
Total Bilirubin: 0.9 mg/dL (ref 0.3–1.2)

## 2017-06-07 LAB — PREGNANCY, URINE: Preg Test, Ur: NEGATIVE

## 2017-06-07 LAB — LIPASE, BLOOD: Lipase: 29 U/L (ref 11–51)

## 2017-06-07 MED ORDER — CEPHALEXIN 500 MG PO CAPS: 500.0000 mg | ORAL_CAPSULE | Freq: Two times a day (BID) | ORAL | 0 refills | Status: AC

## 2017-06-07 NOTE — Discharge Instructions (Signed)
Patient discussion information regarding her condition. Take Keflex twice daily for 1 week. Follow-up with PCP for further evaluation if symptoms persist. Return to ED for worsening pain, increased vomiting, abnormal bleeding, blood in stool, lightheadedness, loss of consciousness.

## 2017-06-07 NOTE — ED Notes (Signed)
Patient to u/s 
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
## 2017-06-07 NOTE — ED Triage Notes (Signed)
Pt sts RLQ pain x 3 days; pt sts had appendix removed in May

## 2017-06-08 LAB — URINE CULTURE: CULTURE: NO GROWTH

## 2017-06-17 NOTE — Addendum Note (Signed)
Addendum  created 06/17/17 1350 by Teralyn Mullins, MD   Sign clinical note    

## 2018-05-24 ENCOUNTER — Encounter (HOSPITAL_COMMUNITY): Payer: Self-pay | Admitting: Emergency Medicine

## 2018-05-24 ENCOUNTER — Emergency Department (HOSPITAL_COMMUNITY): Payer: Self-pay

## 2018-05-24 ENCOUNTER — Other Ambulatory Visit: Payer: Self-pay

## 2018-05-24 ENCOUNTER — Emergency Department (HOSPITAL_COMMUNITY)
Admission: EM | Admit: 2018-05-24 | Discharge: 2018-05-24 | Disposition: A | Discharge status: Home / Self Care | Payer: Self-pay | Attending: Emergency Medicine | Admitting: Emergency Medicine

## 2018-05-24 DIAGNOSIS — R072 Precordial pain: Secondary | ICD-10-CM | POA: Insufficient documentation

## 2018-05-24 LAB — BASIC METABOLIC PANEL
Anion gap: 9 (ref 5–15)
BUN: 6 mg/dL (ref 6–20)
CALCIUM: 9.1 mg/dL (ref 8.9–10.3)
CO2: 26 mmol/L (ref 22–32)
CREATININE: 0.55 mg/dL (ref 0.44–1.00)
Chloride: 103 mmol/L (ref 98–111)
GFR calc non Af Amer: >60 mL/min (ref >60)
Glucose, Bld: 98 mg/dL (ref 70–99)
Potassium: 4.1 mmol/L (ref 3.5–5.1)
Sodium: 138 mmol/L (ref 135–145)

## 2018-05-24 LAB — I-STAT TROPONIN, ED
TROPONIN I, POC: 0 ng/mL (ref 0.00–0.08)
Troponin i, poc: 0 ng/mL (ref 0.00–0.08)

## 2018-05-24 LAB — CBC
HCT: 36.8 % (ref 36.0–46.0)
Hemoglobin: 11.6 g/dL — ABNORMAL LOW (ref 12.0–15.0)
MCH: 27.6 pg (ref 26.0–34.0)
MCHC: 31.5 g/dL (ref 30.0–36.0)
MCV: 87.6 fL (ref 78.0–100.0)
PLATELETS: 247 10*3/uL (ref 150–400)
RBC: 4.2 MIL/uL (ref 3.87–5.11)
RDW: 13.4 % (ref 11.5–15.5)
WBC: 8.3 10*3/uL (ref 4.0–10.5)

## 2018-05-24 LAB — I-STAT BETA HCG BLOOD, ED (MC, WL, AP ONLY): I-stat hCG, quantitative: <5 m[IU]/mL (ref <5)

## 2018-05-24 MED ORDER — IBUPROFEN 400 MG PO TABS
600.0000 mg | ORAL_TABLET | Freq: Once | ORAL | Status: AC
  Administered 2018-05-24: 600 mg via ORAL
  Filled 2018-05-24: qty 1
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
## 2018-05-24 NOTE — Discharge Instructions (Signed)
Continue taking her medication as previously prescribed.  Return to the emergency department if symptoms worsen. Recommend tylenol or ibuprofen as needed for pain following dosing instructions on packaging.

## 2018-05-24 NOTE — ED Triage Notes (Signed)
Pt states started having CP this morning also states her right wrist is hurting her, some tingling into left leg. SOB only with deep inspiration.

## 2019-01-06 ENCOUNTER — Other Ambulatory Visit: Payer: Self-pay | Admitting: Ophthalmology

## 2019-01-06 ENCOUNTER — Other Ambulatory Visit (HOSPITAL_COMMUNITY): Payer: Self-pay | Admitting: Ophthalmology

## 2019-01-06 DIAGNOSIS — H4710 Unspecified papilledema: Secondary | ICD-10-CM

## 2019-01-17 ENCOUNTER — Ambulatory Visit (HOSPITAL_COMMUNITY): Admission: RE | Admit: 2019-01-17 | Payer: Medicaid Other | Source: Ambulatory Visit

## 2019-01-17 ENCOUNTER — Other Ambulatory Visit (HOSPITAL_COMMUNITY): Payer: Medicaid Other

## 2019-02-14 ENCOUNTER — Ambulatory Visit (HOSPITAL_COMMUNITY): Payer: Medicaid Other

## 2019-02-14 ENCOUNTER — Encounter (HOSPITAL_COMMUNITY): Payer: Self-pay

## 2019-02-14 ENCOUNTER — Other Ambulatory Visit: Payer: Self-pay

## 2019-02-14 ENCOUNTER — Ambulatory Visit (HOSPITAL_COMMUNITY)
Admission: RE | Admit: 2019-02-14 | Discharge: 2019-02-14 | Disposition: A | Discharge status: Home / Self Care | Payer: Self-pay | Source: Ambulatory Visit | Attending: Ophthalmology | Admitting: Ophthalmology

## 2019-02-14 DIAGNOSIS — H4710 Unspecified papilledema: Secondary | ICD-10-CM | POA: Insufficient documentation

## 2019-02-14 MED ORDER — GADOBUTROL 1 MMOL/ML IV SOLN
10.0000 mL | Freq: Once | INTRAVENOUS | Status: AC | PRN
  Administered 2019-02-14: 17:00:00 10 mL via INTRAVENOUS

## 2020-04-22 ENCOUNTER — Encounter (HOSPITAL_COMMUNITY): Payer: Self-pay | Admitting: Emergency Medicine

## 2020-04-22 ENCOUNTER — Emergency Department (HOSPITAL_COMMUNITY)
Admission: EM | Admit: 2020-04-22 | Discharge: 2020-04-22 | Disposition: A | Discharge status: Home / Self Care | Payer: Medicaid Other | Attending: Emergency Medicine | Admitting: Emergency Medicine

## 2020-04-22 DIAGNOSIS — T300 Burn of unspecified body region, unspecified degree: Secondary | ICD-10-CM

## 2020-04-22 DIAGNOSIS — Y929 Unspecified place or not applicable: Secondary | ICD-10-CM | POA: Insufficient documentation

## 2020-04-22 DIAGNOSIS — Z23 Encounter for immunization: Secondary | ICD-10-CM | POA: Insufficient documentation

## 2020-04-22 DIAGNOSIS — T2029XA Burn of second degree of multiple sites of head, face, and neck, initial encounter: Secondary | ICD-10-CM | POA: Insufficient documentation

## 2020-04-22 DIAGNOSIS — T22132A Burn of first degree of left upper arm, initial encounter: Secondary | ICD-10-CM | POA: Insufficient documentation

## 2020-04-22 DIAGNOSIS — T22112A Burn of first degree of left forearm, initial encounter: Secondary | ICD-10-CM | POA: Insufficient documentation

## 2020-04-22 DIAGNOSIS — Y9389 Activity, other specified: Secondary | ICD-10-CM | POA: Insufficient documentation

## 2020-04-22 DIAGNOSIS — T23162A Burn of first degree of back of left hand, initial encounter: Secondary | ICD-10-CM | POA: Insufficient documentation

## 2020-04-22 DIAGNOSIS — Y999 Unspecified external cause status: Secondary | ICD-10-CM | POA: Insufficient documentation

## 2020-04-22 DIAGNOSIS — X030XXA Exposure to flames in controlled fire, not in building or structure, initial encounter: Secondary | ICD-10-CM | POA: Insufficient documentation

## 2020-04-22 MED ORDER — TETANUS-DIPHTH-ACELL PERTUSSIS 5-2.5-18.5 LF-MCG/0.5 IM SUSP
0.5000 mL | Freq: Once | INTRAMUSCULAR | Status: AC
  Administered 2020-04-22: 0.5 mL via INTRAMUSCULAR
  Filled 2020-04-22: qty 0.5

## 2020-04-22 MED ORDER — BACITRACIN-NEOMYCIN-POLYMYXIN OINTMENT TUBE
TOPICAL_OINTMENT | Freq: Two times a day (BID) | CUTANEOUS | Status: DC
  Filled 2020-04-22: qty 14

## 2020-04-22 MED ORDER — BACITRACIN ZINC 500 UNIT/GM EX OINT
1.0000 | TOPICAL_OINTMENT | Freq: Two times a day (BID) | CUTANEOUS | 0 refills | Status: AC
Start: 2020-04-22 — End: ?
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
## 2020-04-22 NOTE — ED Notes (Signed)
Patient verbalizes understanding of discharge instructions. Opportunity for questioning and answers were provided. Armband removed by staff, pt discharged from ED.  

## 2020-10-29 ENCOUNTER — Ambulatory Visit: Payer: Medicaid Other | Attending: Internal Medicine

## 2020-10-29 DIAGNOSIS — Z23 Encounter for immunization: Secondary | ICD-10-CM

## 2020-10-29 NOTE — Progress Notes (Signed)
   Covid-19 Vaccination Clinic  Name:  Anita Harrison    MRN: [id number] DOB: [birth year]  10/29/2020  [name] was observed post Covid-19 immunization for 15 minutes without incident. She was provided with Vaccine Information Sheet and instruction to access the V-Safe system.   [name] was instructed to call 911 with any severe reactions post vaccine: Marland Kitchen Difficulty breathing  . Swelling of face and throat  . A fast heartbeat  . A bad rash all over body  . Dizziness and weakness   Immunizations Administered    Name Date Dose VIS Date Route   Moderna Covid-19 Booster Vaccine 10/29/2020  1:30 PM 0.25 mL 08/14/2020 Intramuscular   Manufacturer: Gala Murdoch   Lot: [id number]   NDC: 85929-244-62

## 2021-12-25 ENCOUNTER — Emergency Department (HOSPITAL_COMMUNITY)
Admission: EM | Admit: 2021-12-25 | Discharge: 2021-12-25 | Disposition: A | Discharge status: Home / Self Care | Payer: Medicaid Other | Attending: Emergency Medicine | Admitting: Emergency Medicine

## 2021-12-25 ENCOUNTER — Encounter (HOSPITAL_COMMUNITY): Payer: Self-pay

## 2021-12-25 ENCOUNTER — Other Ambulatory Visit: Payer: Self-pay

## 2021-12-25 DIAGNOSIS — N9489 Other specified conditions associated with female genital organs and menstrual cycle: Secondary | ICD-10-CM | POA: Insufficient documentation

## 2021-12-25 DIAGNOSIS — R350 Frequency of micturition: Secondary | ICD-10-CM | POA: Insufficient documentation

## 2021-12-25 DIAGNOSIS — R109 Unspecified abdominal pain: Secondary | ICD-10-CM | POA: Insufficient documentation

## 2021-12-25 DIAGNOSIS — R300 Dysuria: Secondary | ICD-10-CM | POA: Insufficient documentation

## 2021-12-25 LAB — CBC
HCT: 37.7 % (ref 36.0–46.0)
Hemoglobin: 12.4 g/dL (ref 12.0–15.0)
MCH: 29.1 pg (ref 26.0–34.0)
MCHC: 32.9 g/dL (ref 30.0–36.0)
MCV: 88.5 fL (ref 80.0–100.0)
Platelets: 265 10*3/uL (ref 150–400)
RBC: 4.26 MIL/uL (ref 3.87–5.11)
RDW: 13.1 % (ref 11.5–15.5)
WBC: 8.8 10*3/uL (ref 4.0–10.5)
nRBC: 0 % (ref 0.0–0.2)

## 2021-12-25 LAB — COMPREHENSIVE METABOLIC PANEL
ALT: 18 U/L (ref 0–44)
AST: 18 U/L (ref 15–41)
Albumin: 3.7 g/dL (ref 3.5–5.0)
Alkaline Phosphatase: 61 U/L (ref 38–126)
Anion gap: 6 (ref 5–15)
BUN: 10 mg/dL (ref 6–20)
CO2: 27 mmol/L (ref 22–32)
Calcium: 8.9 mg/dL (ref 8.9–10.3)
Chloride: 106 mmol/L (ref 98–111)
Creatinine, Ser: 0.58 mg/dL (ref 0.44–1.00)
GFR, Estimated: >60 mL/min (ref >60)
Glucose, Bld: 91 mg/dL (ref 70–99)
Potassium: 4 mmol/L (ref 3.5–5.1)
Sodium: 139 mmol/L (ref 135–145)
Total Bilirubin: 0.5 mg/dL (ref 0.3–1.2)
Total Protein: 6.4 g/dL — ABNORMAL LOW (ref 6.5–8.1)

## 2021-12-25 LAB — I-STAT BETA HCG BLOOD, ED (MC, WL, AP ONLY): I-stat hCG, quantitative: <5 m[IU]/mL (ref <5)

## 2021-12-25 LAB — URINALYSIS, ROUTINE W REFLEX MICROSCOPIC
Bilirubin Urine: NEGATIVE
Glucose, UA: NEGATIVE mg/dL
Ketones, ur: NEGATIVE mg/dL
Nitrite: NEGATIVE
Protein, ur: NEGATIVE mg/dL
RBC / HPF: >50 RBC/hpf — ABNORMAL HIGH (ref 0–5)
Specific Gravity, Urine: 1.023 (ref 1.005–1.030)
pH: 5 (ref 5.0–8.0)

## 2021-12-25 LAB — LIPASE, BLOOD: Lipase: 30 U/L (ref 11–51)

## 2021-12-25 MED ORDER — OXYCODONE-ACETAMINOPHEN 5-325 MG PO TABS
1.0000 | ORAL_TABLET | Freq: Once | ORAL | Status: AC
  Administered 2021-12-25: 1 via ORAL
  Filled 2021-12-25: qty 1

## 2021-12-25 MED ORDER — PHENAZOPYRIDINE HCL 200 MG PO TABS: 200.0000 mg | ORAL_TABLET | Freq: Three times a day (TID) | ORAL | 0 refills | Status: AC

## 2021-12-25 MED ORDER — CEPHALEXIN 500 MG PO CAPS: 500.0000 mg | ORAL_CAPSULE | Freq: Three times a day (TID) | ORAL | 0 refills | Status: AC

## 2021-12-25 MED ORDER — HYDROCODONE-ACETAMINOPHEN 5-325 MG PO TABS: 1.0000 | ORAL_TABLET | Freq: Once | ORAL | Status: DC

## 2021-12-25 NOTE — ED Triage Notes (Signed)
Right flank pain and dysuria x 3 days.  ?

## 2021-12-25 NOTE — Discharge Instructions (Addendum)
Please drink plenty of water, add a small amount of lemon juice to your water when you drink.  Follow-up with your primary care doctor.  You may always return to the ER for new or concerning symptoms. ?I am writing a prescription for a short course of antibiotics however your urinalysis abnormalities could certainly be explained by recent passage of a kidney stone. ? ?Please use Tylenol or ibuprofen for pain.  You may use 600 mg ibuprofen every 6 hours or 1000 mg of Tylenol every 6 hours.  You may choose to alternate between the 2.  This would be most effective.  Not to exceed 4 g of Tylenol within 24 hours.  Not to exceed 3200 mg ibuprofen 24 hours.  ?

## 2021-12-25 NOTE — ED Provider Notes (Signed)
?[facility] ?Provider Note ? ? ?CSN: [id number] ?Arrival date & time: 12/25/21  0630 ? ?  ? ?History ? ?Chief Complaint  ?Patient presents with  ? Flank Pain  ? ? ?Anita Harrison is a 41 y.o. female. ? ? ?Flank Pain ? ?Patient is a 41 year old female with past medical history significant for hives, appendicitis s/p appendectomy, tubal ligation ? ?She is presenting to the emergency room today with complaints of right flank pain and dysuria for the past 3 days.  She states that she has burning between her legs when she urinates.  She also has some urinary frequency.  She has not noticed any hematuria. ?  ? ?Home Medications ?Prior to Admission medications   ?Medication Sig Start Date End Date Taking? Authorizing Provider  ?cephALEXin (KEFLEX) 500 MG capsule Take 1 capsule (500 mg total) by mouth 3 (three) times daily for 5 days. 12/25/21 12/30/21 Yes Sharice Harriss, Kathleene Hazel, PA  ?phenazopyridine (PYRIDIUM) 200 MG tablet Take 1 tablet (200 mg total) by mouth 3 (three) times daily. 12/25/21  Yes Pati Gallo S, PA  ?acetaminophen (TYLENOL) 325 MG tablet You can use plain Tylenol or ibuprofen as your first line treatment for pain. You can alternate Tylenol with ibuprofen also for pain control. Do not take more than 4000 mg of Tylenol per day. Your prescribed pain medication has Tylenol in it. You must count each one of your prescribed pain medication tablets as a Tylenol. ?Patient not taking: Reported on 05/24/2018 02/23/17   Earnstine Regal, PA-C  ?bacitracin ointment Apply 1 application topically 2 (two) times daily. Apply to burn twice a day. 04/22/20   Davonna Belling, MD  ?ibuprofen (ADVIL,MOTRIN) 200 MG tablet You can safely take 2-3 tablets every 6 hours as needed for pain. You can use this as a first line treatment for pain. Can alternate it with plain Tylenol. If this is inadequate you can take a prescribed pain medication as your second line of treatment for pain. ?Patient not  taking: Reported on 05/24/2018 02/23/17   Earnstine Regal, PA-C  ?oxyCODONE-acetaminophen (PERCOCET/ROXICET) 5-325 MG tablet Take 1-2 tablets by mouth every 4 (four) hours as needed for moderate pain. ?Patient not taking: Reported on 06/07/2017 02/23/17   Earnstine Regal, PA-C  ?   ? ?Allergies    ?Patient has no known allergies.   ? ?Review of Systems   ?Review of Systems  ?Genitourinary:  Positive for flank pain.  ? ?Physical Exam ?Updated Vital Signs ?BP 114/75   Pulse (!) 59   Temp 97.6 ?F (36.4 ?C) (Oral)   Resp 11   Ht 5\' 4"  (1.626 m)   Wt 90.7 kg   LMP  (LMP Unknown)   SpO2 95%   BMI 34.33 kg/m?  ?Physical Exam ?Vitals and nursing note reviewed.  ?Constitutional:   ?   General: She is not in acute distress. ?HENT:  ?   Head: Normocephalic and atraumatic.  ?   Nose: Nose normal.  ?   Mouth/Throat:  ?   Mouth: Mucous membranes are moist.  ?Eyes:  ?   General: No scleral icterus. ?Cardiovascular:  ?   Rate and Rhythm: Normal rate and regular rhythm.  ?   Pulses: Normal pulses.  ?   Heart sounds: Normal heart sounds.  ?Pulmonary:  ?   Effort: Pulmonary effort is normal. No respiratory distress.  ?   Breath sounds: No wheezing.  ?Abdominal:  ?   Palpations: Abdomen is soft.  ?   Tenderness:  There is no abdominal tenderness. There is no right CVA tenderness, left CVA tenderness, guarding or rebound.  ?Musculoskeletal:  ?   Cervical back: Normal range of motion.  ?   Right lower leg: No edema.  ?   Left lower leg: No edema.  ?Skin: ?   General: Skin is warm and dry.  ?   Capillary Refill: Capillary refill takes less than 2 seconds.  ?Neurological:  ?   Mental Status: She is alert. Mental status is at baseline.  ?Psychiatric:     ?   Mood and Affect: Mood normal.     ?   Behavior: Behavior normal.  ? ? ?ED Results / Procedures / Treatments   ?Labs ?(all labs ordered are listed, but only abnormal results are displayed) ?Labs Reviewed  ?URINALYSIS, ROUTINE W REFLEX MICROSCOPIC - Abnormal; Notable for the  following components:  ?    Result Value  ? APPearance HAZY (*)   ? Hgb urine dipstick MODERATE (*)   ? Leukocytes,Ua SMALL (*)   ? RBC / HPF >50 (*)   ? Bacteria, UA RARE (*)   ? All other components within normal limits  ?COMPREHENSIVE METABOLIC PANEL - Abnormal; Notable for the following components:  ? Total Protein 6.4 (*)   ? All other components within normal limits  ?URINE CULTURE  ?LIPASE, BLOOD  ?CBC  ?I-STAT BETA HCG BLOOD, ED ([facility], [facility], [facility] ONLY)  ? ? ?EKG ?None ? ?Radiology ?No results found. ? ?Procedures ?Procedures  ? ? ?Medications Ordered in ED ?Medications  ?oxyCODONE-acetaminophen (PERCOCET/ROXICET) 5-325 MG per tablet 1 tablet (1 tablet Oral Given 12/25/21 1301)  ? ? ?ED Course/ Medical Decision Making/ A&P ?  ?                        ?Medical Decision Making ?Amount and/or Complexity of Data Reviewed ?Labs: ordered. ? ?Risk ?Prescription drug management. ? ? ?This patient presents to the ED for concern of right flank and right lower abdominal pain ongoing for 3 days, this involves a number of treatment options, and is a complaint that carries with it a high risk of complications and morbidity.  The differential diagnosis includes The causes of generalized abdominal pain include but are not limited to AAA, mesenteric ischemia, appendicitis, diverticulitis, DKA, gastritis, gastroenteritis, AMI, nephrolithiasis, pancreatitis, peritonitis, adrenal insufficiency,lead poisoning, iron toxicity, intestinal ischemia, constipation, UTI,SBO/LBO, splenic rupture, biliary disease, IBD, IBS, PUD, or hepatitis. ?Ectopic pregnancy, ovarian torsion, PID.  ? ? ?Co morbidities: ?Discussed in HPI ? ? ?Brief History: ? ?SEE HPI ? ?EMR reviewed including pt PMHx, past surgical history and past visits to ER.  ? ?See HPI for more details ? ? ?Lab Tests: ? ?I ordered and independently interpreted labs.  The pertinent results include:   ? ?Labs notable for CMP, CBC, lipase unremarkable i-STAT hCG negative for pregnancy.   Urine culture sent.  Urinalysis with lots of blood.  Not conclusively evident that there is any infection. ? ? ?Imaging Studies: ? ?No imaging studies ordered for this patient ? ? ? ?Cardiac Monitoring: ? ?NA ?NA ? ? ?Medicines ordered: ? ?I ordered medication including Percocet for pain ?Reevaluation of the patient after these medicines showed that the patient resolved ?I have reviewed the patients home medicines and have made adjustments as needed ? ? ?Critical Interventions: ? ? ? ? ?Consults: ? ? ? ?Reevaluation: ? ?After the interventions noted above I re-evaluated patient and found that they have :resolved ? ? ?  Social Determinants of Health: ? ?The patient's social determinants of health were a factor in the care of this patient ? ? ? ?Problem List / ED Course: ? ?Likely kidney stone ?Hematuria and pain that resolved/significantly improved after passing what looks like a small stone in the toilet during her ER visit. ?We will recommend PCP follow-up return precautions and given recommendations for Tylenol ibuprofen.  We will also provide wait-and-see Keflex prescription her urinalysis does not appear acutely infected however given that she was having some dysuria I think is reasonable to go ahead and have her start taking antibiotics tomorrow if her dysuria does not improve.  Clinically does not have pyelonephritis. ? ?Dispostion: ? ?After consideration of the diagnostic results and the patients response to treatment, I feel that the patent would benefit from close follow-up with urology/PCP.  Return precautions given.  Urine culture pending ?  ? ? ? ? ? ? ?Final Clinical Impression(s) / ED Diagnoses ?Final diagnoses:  ?Flank pain  ? ? ?Rx / DC Orders ?ED Discharge Orders   ? ?      Ordered  ?  cephALEXin (KEFLEX) 500 MG capsule  3 times daily       ? 12/25/21 1428  ?  phenazopyridine (PYRIDIUM) 200 MG tablet  3 times daily       ? 12/25/21 1428  ? ?  ?  ? ?  ? ? ?  ?Tedd Sias, [place] ?12/25/21 1930 ? ?   ?Daleen Bo, MD ?12/26/21 1032 ? ?

## 2021-12-26 LAB — URINE CULTURE: Culture: <10000 — AB

## 2023-09-22 ENCOUNTER — Emergency Department (HOSPITAL_COMMUNITY)
Admission: EM | Admit: 2023-09-22 | Discharge: 2023-09-22 | Disposition: A | Discharge status: Home / Self Care | Payer: Medicaid Other | Attending: Emergency Medicine | Admitting: Emergency Medicine

## 2023-09-22 ENCOUNTER — Encounter (HOSPITAL_COMMUNITY): Payer: Self-pay

## 2023-09-22 ENCOUNTER — Other Ambulatory Visit: Payer: Self-pay

## 2023-09-22 DIAGNOSIS — R202 Paresthesia of skin: Secondary | ICD-10-CM | POA: Diagnosis present

## 2023-09-22 DIAGNOSIS — D72829 Elevated white blood cell count, unspecified: Secondary | ICD-10-CM | POA: Diagnosis not present

## 2023-09-22 DIAGNOSIS — R531 Weakness: Secondary | ICD-10-CM | POA: Diagnosis not present

## 2023-09-22 LAB — CBC WITH DIFFERENTIAL/PLATELET
Abs Immature Granulocytes: 0.03 10*3/uL (ref 0.00–0.07)
Basophils Absolute: 0.1 10*3/uL (ref 0.0–0.1)
Basophils Relative: 1 %
Eosinophils Absolute: 0.1 10*3/uL (ref 0.0–0.5)
Eosinophils Relative: 1 %
HCT: 38.9 % (ref 36.0–46.0)
Hemoglobin: 12.9 g/dL (ref 12.0–15.0)
Immature Granulocytes: 0 %
Lymphocytes Relative: 17 %
Lymphs Abs: 2 10*3/uL (ref 0.7–4.0)
MCH: 28.8 pg (ref 26.0–34.0)
MCHC: 33.2 g/dL (ref 30.0–36.0)
MCV: 86.8 fL (ref 80.0–100.0)
Monocytes Absolute: 0.8 10*3/uL (ref 0.1–1.0)
Monocytes Relative: 7 %
Neutro Abs: 8.6 10*3/uL — ABNORMAL HIGH (ref 1.7–7.7)
Neutrophils Relative %: 74 %
Platelets: 290 10*3/uL (ref 150–400)
RBC: 4.48 MIL/uL (ref 3.87–5.11)
RDW: 13.3 % (ref 11.5–15.5)
WBC: 11.7 10*3/uL — ABNORMAL HIGH (ref 4.0–10.5)
nRBC: 0 % (ref 0.0–0.2)

## 2023-09-22 LAB — BASIC METABOLIC PANEL
Anion gap: 8 (ref 5–15)
BUN: 7 mg/dL (ref 6–20)
CO2: 24 mmol/L (ref 22–32)
Calcium: 9.1 mg/dL (ref 8.9–10.3)
Chloride: 107 mmol/L (ref 98–111)
Creatinine, Ser: 0.79 mg/dL (ref 0.44–1.00)
GFR, Estimated: >60 mL/min (ref >60)
Glucose, Bld: 105 mg/dL — ABNORMAL HIGH (ref 70–99)
Potassium: 3.5 mmol/L (ref 3.5–5.1)
Sodium: 139 mmol/L (ref 135–145)

## 2023-09-22 LAB — HCG, SERUM, QUALITATIVE: Preg, Serum: NEGATIVE

## 2023-09-22 NOTE — ED Notes (Signed)
Pt presents to nursing station with visitor inquiring about discharge paperwork.  This RN provided pt with AVS.  Education complete; all questions answered.  Pt leaving ED in stable condition at this time, ambulatory with all belongings.

## 2023-09-22 NOTE — ED Triage Notes (Signed)
Pt BIB [facility] for leg weakness and bilateral hand and feet numbness/tingling x 20 minutes. Pt reports that she has had similar episodes since August. Pt states taht she does have anxiety and episodes could be related. No neuro deficits during triage assessment. Denies headache/dizziness.

## 2023-09-22 NOTE — ED Provider Notes (Signed)
[place] EMERGENCY DEPARTMENT AT [facility] Provider Note   CSN: [id number] Arrival date & time: 09/22/23  2017     History  Chief Complaint  Patient presents with   Weakness    Braylie Henning is a 42 y.o. female no significant past medical history presents today for leg weakness and bilateral hand and feet numbness/tingling x 20 minutes.  Patient states that she has had similar episodes intermittently since August.  Patient admits history of anxiety and is currently not taking any medication.  Patient denies headache, vision changes, dizziness, nausea, vomiting, diarrhea, chest pain, shortness of breath, or injury.  Patient also states tightness across her shoulders and back.   Weakness      Home Medications Prior to Admission medications   Medication Sig Start Date End Date Taking? Authorizing Provider  acetaminophen (TYLENOL) 325 MG tablet You can use plain Tylenol or ibuprofen as your first line treatment for pain. You can alternate Tylenol with ibuprofen also for pain control. Do not take more than 4000 mg of Tylenol per day. Your prescribed pain medication has Tylenol in it. You must count each one of your prescribed pain medication tablets as a Tylenol. Patient not taking: Reported on 05/24/2018 02/23/17   Sherrie George, PA-C  bacitracin ointment Apply 1 application topically 2 (two) times daily. Apply to burn twice a day. 04/22/20   Benjiman Core, MD  ibuprofen (ADVIL,MOTRIN) 200 MG tablet You can safely take 2-3 tablets every 6 hours as needed for pain. You can use this as a first line treatment for pain. Can alternate it with plain Tylenol. If this is inadequate you can take a prescribed pain medication as your second line of treatment for pain. Patient not taking: Reported on 05/24/2018 02/23/17   Sherrie George, PA-C  oxyCODONE-acetaminophen (PERCOCET/ROXICET) 5-325 MG tablet Take 1-2 tablets by mouth every 4 (four) hours as needed for moderate  pain. Patient not taking: Reported on 06/07/2017 02/23/17   Sherrie George, PA-C  phenazopyridine (PYRIDIUM) 200 MG tablet Take 1 tablet (200 mg total) by mouth 3 (three) times daily. 12/25/21   Gailen Shelter, PA      Allergies    Patient has no known allergies.    Review of Systems   Review of Systems  Musculoskeletal:  Positive for neck pain.  Neurological:  Positive for weakness and numbness.  Psychiatric/Behavioral:  The patient is nervous/anxious.     Physical Exam Updated Vital Signs BP 129/76   Pulse 84   Temp 98.6 F (37 C) (Oral)   Resp 18   Ht 5\' 2"  (1.575 m)   Wt 82.6 kg   LMP 09/08/2023 (Approximate)   SpO2 100%   BMI 33.29 kg/m  Physical Exam Vitals and nursing note reviewed.  Constitutional:      General: She is not in acute distress.    Appearance: She is well-developed.  HENT:     Head: Normocephalic and atraumatic.     Right Ear: External ear normal.     Left Ear: External ear normal.     Mouth/Throat:     Mouth: Mucous membranes are moist.  Eyes:     Extraocular Movements: Extraocular movements intact.     Conjunctiva/sclera: Conjunctivae normal.  Neck:     Comments: Patient has reproducible tenderness along trapezius muscle bilaterally.  Patient neurovascularly intact. Cardiovascular:     Rate and Rhythm: Normal rate and regular rhythm.     Heart sounds: No murmur heard. Pulmonary:  Effort: Pulmonary effort is normal. No respiratory distress.     Breath sounds: Normal breath sounds.  Abdominal:     Palpations: Abdomen is soft.     Tenderness: There is no abdominal tenderness.  Musculoskeletal:        General: No swelling.     Cervical back: Neck supple.     Right lower leg: No edema.     Left lower leg: No edema.     Comments: 5 out of 5 strength in bilateral upper and lower extremities.  Patient is neurovascularly intact.  Skin:    General: Skin is warm and dry.     Capillary Refill: Capillary refill takes less than 2 seconds.   Neurological:     General: No focal deficit present.     Mental Status: She is alert.     Sensory: No sensory deficit.     Motor: No weakness.  Psychiatric:        Mood and Affect: Mood normal.     ED Results / Procedures / Treatments   Labs (all labs ordered are listed, but only abnormal results are displayed) Labs Reviewed  CBC WITH DIFFERENTIAL/PLATELET - Abnormal; Notable for the following components:      Result Value   WBC 11.7 (*)    Neutro Abs 8.6 (*)    All other components within normal limits  BASIC METABOLIC PANEL - Abnormal; Notable for the following components:   Glucose, Bld 105 (*)    All other components within normal limits  HCG, SERUM, QUALITATIVE    EKG None  Radiology No results found.  Procedures Procedures    Medications Ordered in ED Medications - No data to display  ED Course/ Medical Decision Making/ A&P                                 Medical Decision Making Amount and/or Complexity of Data Reviewed Labs: ordered.   This patient presents to the ED with chief complaint(s) of numbness and tingling with pertinent past medical history of anxiety which further complicates the presenting complaint. The complaint involves an extensive differential diagnosis and also carries with it a high risk of complications and morbidity.    The differential diagnosis includes anxiety attack, arrhythmia, electrolyte abnormality   ED Course and Reassessment:   Independent labs interpretation:  The following labs were independently interpreted:  EKG: Normal sinus BMP: No notable findings CBC: Mild leukocytosis hCG serum: Negative  Consultation: - Consulted or discussed management/test interpretation w/ external professional: None   Consideration for admission or further workup: Patient due for admission or further workup however patient's vital signs, physical exam, labs have all been reassuring.  Patient's symptoms have all resolved and are  most likely due to anxiety.  Patient should have outpatient workup with primary care provider if symptoms persist.         Final Clinical Impression(s) / ED Diagnoses Final diagnoses:  Numbness and tingling    Rx / DC Orders ED Discharge Orders     None         Gretta Began 09/22/23 2206    Laurence Spates, MD 09/23/23 1453

## 2023-09-22 NOTE — Discharge Instructions (Addendum)
Today you were seen for intermittent numbness and tingling. Thank you for letting us treat you today. After performing a physical exam and reviewing your labs, I feel you are safe to go home.  If your symptoms persist please follow-up with your PCP for further evaluation and treatment of symptoms.  Please follow up with your PCP in the next several days and provide them with your records from this visit. Return to the Emergency Room if pain becomes severe or symptoms worsen.

## 2023-12-02 ENCOUNTER — Other Ambulatory Visit: Payer: Self-pay | Admitting: Family Medicine

## 2023-12-02 DIAGNOSIS — Z1231 Encounter for screening mammogram for malignant neoplasm of breast: Secondary | ICD-10-CM

## 2023-12-16 ENCOUNTER — Ambulatory Visit: Payer: Medicaid Other

## 2024-01-04 ENCOUNTER — Ambulatory Visit
Admission: RE | Admit: 2024-01-04 | Discharge: 2024-01-04 | Disposition: A | Discharge status: Home / Self Care | Payer: Medicaid Other | Source: Ambulatory Visit | Attending: Family Medicine | Admitting: Family Medicine

## 2024-01-04 DIAGNOSIS — Z1231 Encounter for screening mammogram for malignant neoplasm of breast: Secondary | ICD-10-CM

## 2024-11-29 ENCOUNTER — Other Ambulatory Visit: Payer: Self-pay

## 2024-11-29 ENCOUNTER — Emergency Department (HOSPITAL_COMMUNITY): Payer: Self-pay

## 2024-11-29 ENCOUNTER — Encounter (HOSPITAL_COMMUNITY): Payer: Self-pay

## 2024-11-29 ENCOUNTER — Emergency Department (HOSPITAL_COMMUNITY)
Admission: EM | Admit: 2024-11-29 | Discharge: 2024-11-30 | Disposition: A | Payer: Self-pay | Source: Home / Self Care | Attending: Emergency Medicine | Admitting: Emergency Medicine

## 2024-11-29 DIAGNOSIS — R1031 Right lower quadrant pain: Secondary | ICD-10-CM

## 2024-11-29 LAB — COMPREHENSIVE METABOLIC PANEL WITH GFR
ALT: 13 U/L (ref 0–44)
AST: 20 U/L (ref 15–41)
Albumin: 4.4 g/dL (ref 3.5–5.0)
Alkaline Phosphatase: 69 U/L (ref 38–126)
Anion gap: 10 (ref 5–15)
BUN: 12 mg/dL (ref 6–20)
CO2: 25 mmol/L (ref 22–32)
Calcium: 9.5 mg/dL (ref 8.9–10.3)
Chloride: 104 mmol/L (ref 98–111)
Creatinine, Ser: 0.58 mg/dL (ref 0.44–1.00)
GFR, Estimated: >60 mL/min
Glucose, Bld: 94 mg/dL (ref 70–99)
Potassium: 4.6 mmol/L (ref 3.5–5.1)
Sodium: 138 mmol/L (ref 135–145)
Total Bilirubin: 0.2 mg/dL (ref 0.0–1.2)
Total Protein: 7.1 g/dL (ref 6.5–8.1)

## 2024-11-29 LAB — URINALYSIS, ROUTINE W REFLEX MICROSCOPIC
Bilirubin Urine: NEGATIVE
Glucose, UA: NEGATIVE mg/dL
Hgb urine dipstick: NEGATIVE
Ketones, ur: NEGATIVE mg/dL
Leukocytes,Ua: NEGATIVE
Nitrite: NEGATIVE
Protein, ur: NEGATIVE mg/dL
Specific Gravity, Urine: 1.012 (ref 1.005–1.030)
pH: 5 (ref 5.0–8.0)

## 2024-11-29 LAB — LIPASE, BLOOD: Lipase: 35 U/L (ref 11–51)

## 2024-11-29 LAB — CBC WITH DIFFERENTIAL/PLATELET
Abs Immature Granulocytes: 0.05 10*3/uL (ref 0.00–0.07)
Basophils Absolute: 0.1 10*3/uL (ref 0.0–0.1)
Basophils Relative: 1 %
Eosinophils Absolute: 0.2 10*3/uL (ref 0.0–0.5)
Eosinophils Relative: 2 %
HCT: 39.8 % (ref 36.0–46.0)
Hemoglobin: 13.2 g/dL (ref 12.0–15.0)
Immature Granulocytes: 0 %
Lymphocytes Relative: 18 %
Lymphs Abs: 2.1 10*3/uL (ref 0.7–4.0)
MCH: 28.9 pg (ref 26.0–34.0)
MCHC: 33.2 g/dL (ref 30.0–36.0)
MCV: 87.1 fL (ref 80.0–100.0)
Monocytes Absolute: 0.9 10*3/uL (ref 0.1–1.0)
Monocytes Relative: 8 %
Neutro Abs: 8.1 10*3/uL — ABNORMAL HIGH (ref 1.7–7.7)
Neutrophils Relative %: 71 %
Platelets: 325 10*3/uL (ref 150–400)
RBC: 4.57 MIL/uL (ref 3.87–5.11)
RDW: 13.2 % (ref 11.5–15.5)
WBC: 11.5 10*3/uL — ABNORMAL HIGH (ref 4.0–10.5)
nRBC: 0 % (ref 0.0–0.2)

## 2024-11-29 LAB — HCG, SERUM, QUALITATIVE: Preg, Serum: NEGATIVE

## 2024-11-29 MED ORDER — FENTANYL CITRATE (PF) 50 MCG/ML IJ SOSY
50.0000 ug | PREFILLED_SYRINGE | Freq: Once | INTRAMUSCULAR | Status: AC
  Administered 2024-11-29: 50 ug via INTRAVENOUS
  Filled 2024-11-29: qty 1

## 2024-11-29 NOTE — ED Provider Triage Note (Signed)
 Emergency Medicine Provider Triage Evaluation Note  Anita Harrison , a 44 y.o. female  was evaluated in triage.  Pt complains of lower abdominal pain that was sudden in onset.  Occurred about 1 hour prior to arrival and has been fairly constant since then.  Denies any associated nausea, vomiting, diarrhea.  Denies any fever or chills at home.  Denies any hematuria, dysuria.  Reports this type of pain has happened previously, but never this bad.  Reports previous abdominal surgery of appendectomy.   Review of Systems  Positive: As above Negative: As above  Physical Exam  BP (!) 131/93 (BP Location: Left Arm)   Pulse 85   Temp 98.4 F (36.9 C) (Oral)   Resp 20   SpO2 100%  Gen:   Awake, no distress, holding abdomen and in pain Resp:  Normal effort  MSK:   Moves extremities without difficulty  Other:  TTP of suprapubic region and RLQ  Medical Decision Making  Medically screening exam initiated at 9:07 PM.  Appropriate orders placed.  Darlys Buis was informed that the remainder of the evaluation will be completed by another provider, this initial triage assessment does not replace that evaluation, and the importance of remaining in the ED until their evaluation is complete.    Veta Palma, PA-C 11/29/24 2107

## 2024-11-29 NOTE — ED Triage Notes (Signed)
 Pt has lower abdominal pain x 1 hour without nausea/vomiting. Denies dysuria

## 2024-11-29 NOTE — ED Triage Notes (Signed)
 C-t reports that this pt is next on the list for procedure

## 2024-11-29 NOTE — ED Notes (Signed)
 Collected urinalysis but no label on sample, resubmitted urine informed nurse.[email or web address]

## 2024-11-30 ENCOUNTER — Emergency Department (HOSPITAL_COMMUNITY): Payer: Self-pay

## 2024-11-30 MED ORDER — ONDANSETRON HCL 4 MG/2ML IJ SOLN
4.0000 mg | Freq: Once | INTRAMUSCULAR | Status: AC
  Administered 2024-11-30: 4 mg via INTRAVENOUS
  Filled 2024-11-30: qty 2

## 2024-11-30 MED ORDER — MORPHINE SULFATE (PF) 2 MG/ML IV SOLN
4.0000 mg | Freq: Once | INTRAVENOUS | Status: AC
  Administered 2024-11-30: 4 mg via INTRAVENOUS
  Filled 2024-11-30: qty 2

## 2024-11-30 MED ORDER — IOHEXOL 350 MG/ML SOLN
75.0000 mL | Freq: Once | INTRAVENOUS | Status: AC | PRN
  Administered 2024-11-30: 75 mL via INTRAVENOUS

## 2024-11-30 MED ORDER — KETOROLAC TROMETHAMINE 15 MG/ML IJ SOLN
15.0000 mg | Freq: Once | INTRAMUSCULAR | Status: AC
  Administered 2024-11-30: 15 mg via INTRAVENOUS
  Filled 2024-11-30: qty 1

## 2024-11-30 MED ORDER — ONDANSETRON 4 MG PO TBDP: 8.0000 mg | ORAL_TABLET | Freq: Once | ORAL | Status: DC

## 2024-11-30 NOTE — ED Triage Notes (Signed)
 Pt to ct

## 2024-11-30 NOTE — ED Provider Notes (Incomplete)
 " [place] [facility]GENC[facility] DE[facility]MENT AT [facility] Provider Note   CSN: [id number] Arrival date & time: 11/29/24  2046     Patient presents with: Abdominal Pain   Anita Harrison is a 44 y.o. female.  {Add pertinent medical, surgical, social history, OB history to HPI:32947}  Abdominal Pain      Prior to Admission medications  Medication Sig Start Date End Date Taking? Authorizing Provider  acetaminophen  (TYLENOL ) 325 MG tablet You can use plain Tylenol  or ibuprofen  as your first line treatment for pain. You can alternate Tylenol  with ibuprofen  also for pain control. Do not take more than 4000 mg of Tylenol  per day. Your prescribed pain medication has Tylenol  in it. You must count each one of your prescribed pain medication tablets as a Tylenol . Patient not taking: Reported on 05/24/2018 02/23/17   Tonnie George, PA-C  bacitracin  ointment Apply 1 application topically 2 (two) times daily. Apply to burn twice a day. 04/22/20   Patsey Lot, MD  ibuprofen  (ADVIL ,MOTRIN ) 200 MG tablet You can safely take 2-3 tablets every 6 hours as needed for pain. You can use this as a first line treatment for pain. Can alternate it with plain Tylenol . If this is inadequate you can take a prescribed pain medication as your second line of treatment for pain. Patient not taking: Reported on 05/24/2018 02/23/17   Tonnie George, PA-C  oxyCODONE -acetaminophen  (PERCOCET/ROXICET) 5-325 MG tablet Take 1-2 tablets by mouth every 4 (four) hours as needed for moderate pain. Patient not taking: Reported on 06/07/2017 02/23/17   Tonnie George, PA-C  phenazopyridine  (PYRIDIUM ) 200 MG tablet Take 1 tablet (200 mg total) by mouth 3 (three) times daily. 12/25/21   Neldon Hamp RAMAN, PA    Allergies: Patient has no known allergies.    Review of Systems  Gastrointestinal:  Positive for abdominal pain.    Updated Vital Signs BP 114/78 (BP Location: Left Arm)   Pulse 74   Temp 98.5 F (36.9 C)   Resp  18   SpO2 99%   Physical Exam  (all labs ordered are listed, but only abnormal results are displayed) Labs Reviewed  CBC WITH DIFFERENTIAL/PLATELET - Abnormal; Notable for the following components:      Result Value   WBC 11.5 (*)    Neutro Abs 8.1 (*)    All other components within normal limits  COMPREHENSIVE METABOLIC PANEL WITH GFR  LIPASE, BLOOD  HCG, SERUM, QUALITATIVE  URINALYSIS, ROUTINE W REFLEX MICROSCOPIC    EKG: None  Radiology: US  PELVIC TRANSABD W/PELVIC DOPPLER Result Date: 11/30/2024 EXAM: US  Pelvis, Complete Transvaginal and Transabdominal with Doppler 11/30/2024 01:56:00 AM TECHNIQUE: Transabdominal and transvaginal pelvic duplex ultrasound using B-mode/gray scaled imaging with Doppler spectral analysis and color flow was obtained. COMPARISON: CT abdomen and pelvis 11/30/2024 CLINICAL HISTORY: Right lower quadrant pain; abdominal pain. FINDINGS: UTERUS: Uterus measures 9.1 x 4.2 x 5.7 cm. Uterus demonstrates normal myometrial echotexture. ENDOMETRIAL STRIPE: Endometrial measures 7 mm. Endometrial stripe is within normal limits. RIGHT OVARY: Right ovary measures 3.6 x 2.6 x 2.8 cm. Right ovary is within normal limits. There is normal arterial and venous Doppler flow. LEFT OVARY: Left ovary measures 3.2 x 2.2 x 2.9 cm. Left ovary is within normal limits. There is normal arterial and venous Doppler flow. FREE FLUID: Trace free fluid in the pelvis. IMPRESSION: 1. No acute findings. 2.  Trace free fluid in the pelvis. Electronically signed by: Greig Pique MD 11/30/2024 02:03 AM EST RP Workstation: [id number]   CT  ABDOMEN PELVIS W CONTRAST Result Date: 11/30/2024 EXAM: CT ABDOMEN AND PELVIS WITH CONTRAST 11/30/2024 12:19:24 AM TECHNIQUE: CT of the abdomen and pelvis was performed with the administration of 75 mL of iohexol  (OMNIPAQUE ) 350 MG/ML injection. Multiplanar reformatted images are provided for review. Automated exposure control, iterative reconstruction, and/or  weight-based adjustment of the mA/kV was utilized to reduce the radiation dose to as low as reasonably achievable. COMPARISON: CT abdomen and pelvis 02/22/2017. CLINICAL HISTORY: Lower abdominal pain. FINDINGS: LOWER CHEST: No acute abnormality. LIVER: The liver is unremarkable. GALLBLADDER AND BILE DUCTS: Gallbladder is unremarkable. No biliary ductal dilatation. SPLEEN: No acute abnormality. PANCREAS: No acute abnormality. ADRENAL GLANDS: No acute abnormality. KIDNEYS, URETERS AND BLADDER: No stones in the kidneys or ureters. No hydronephrosis. No perinephric or periureteral stranding. Urinary bladder is unremarkable. GI AND BOWEL: Stomach demonstrates no acute abnormality. There is no bowel obstruction. The appendix is surgically absent. There is mild central mesenteric haziness with prominent lymph nodes similar to the prior study. PERITONEUM AND RETROPERITONEUM: There is trace hyperdense free fluid in the pelvis. No free air. VASCULATURE: Aorta is normal in caliber. LYMPH NODES: Prominent lymph nodes are noted in the mesentery, similar to the prior study. REPRODUCTIVE ORGANS: There is a surgical clip in the left adnexa. Right ovary is mildly prominent in size compared to the left. BONES AND SOFT TISSUES: No acute osseous abnormality. No focal soft tissue abnormality. IMPRESSION: 1. Trace hyperdense free fluid in the pelvis likely hemoperitoneum. 2. Mildly prominent right ovary compared to the left. Is suited for further evaluation with pelvic ultrasound with hemoperitoneum. 3. Stable mild central mesenteric haziness with prominent lymph nodes, likely related to chronic inflammation. Electronically signed by: Greig Pique MD 11/30/2024 12:38 AM EST RP Workstation: [id number]    {Document cardiac monitor, telemetry assessment procedure when appropriate:32947} Procedures   Medications Ordered in the ED  fentaNYL  (SUBLIMAZE ) injection 50 mcg (50 mcg Intravenous Given 11/29/24 2144)  iohexol  (OMNIPAQUE ) 350  MG/ML injection 75 mL (75 mLs Intravenous Contrast Given 11/30/24 0019)  ketorolac  (TORADOL ) 15 MG/ML injection 15 mg (15 mg Intravenous Given 11/30/24 0907)  morphine  (PF) 2 MG/ML injection 4 mg (4 mg Intravenous Given 11/30/24 0928)  ondansetron  (ZOFRAN ) injection 4 mg (4 mg Intravenous Given 11/30/24 0928)      {Click here for ABCD2, HEART and other calculators REFRESH Note before signing:1}                              Medical Decision Making Risk Prescription drug management.   Patient presents to the ED with complaints of abdominal pain. Patient nontoxic appearing, in no apparent distress, vitals WNL. On exam patient tender to palpation in right lower quadrant and suprapubic region, no peritoneal signs. Will evaluate with labs and CT and ultrasound.   Ddx including but not limited to: Appendicitis, cholecystitis, diverticulitis, obstruction, perforation, ovarian cyst, ovarian torsion, PID, UTI, nephrolithiasis, pyelonephritis  Additional history obtained:  Additional history obtained from chart review & nursing note review.   Lab Tests:  I Ordered, viewed, and interpreted labs, which included:  CBC: *** CMP: *** Lipase: *** UA: *** Preg test: ***  Imaging Studies ordered:  I ordered imaging studies which included ***, I independently reviewed, formal radiology impression shows:  ***  ED Course:  I ordered ***  *** RE-EVAL: ***  On repeat abdominal exam patient remains without peritoneal signs, low suspicion for cholecystitis, pancreatitis, diverticulitis, appendicitis, bowel obstruction/perforation, *** PID, ectopic  pregnancy, or other acute surgical process. Patient tolerating PO in the emergency department. Will discharge home with supportive measures. I discussed results, treatment plan, need for PCP follow-up, and return precautions with the patient. Provided opportunity for questions, patient confirmed understanding and is in agreement with plan.   Portions of this note  were generated with Scientist, clinical (histocompatibility and immunogenetics). Dictation errors may occur despite best attempts at proofreading.     {Document critical care time when appropriate  Document review of labs and clinical decision tools ie [id number], etc  Document your independent review of radiology images and any outside records  Document your discussion with family members, caretakers and with consultants  Document social determinants of health affecting pt's care  Document your decision making why or why not admission, treatments were needed:32947:::1}   Final diagnoses:  Right lower quadrant abdominal pain    ED Discharge Orders     None        "

## 2024-11-30 NOTE — Discharge Instructions (Signed)
 Your CT scan and ultrasound showed a small amount of free fluid in the pelvis I suspect you may have had a recently ruptured ovarian cyst as we do not see any evidence of cysts or other abnormalities at this point that can cause sudden pain and the fluid in your pelvis can cause irritation pain.  Otherwise your lab work and imaging were very reassuring.  To help treat pain take ibuprofen  600 mg every 6 hours and Tylenol  650 mg every 6 hours.  Follow-up with your primary care provider if pain is not resolved in about a week.  If you develop new or worsening pain or other new concerning symptoms return to the emergency department.  ------------  Clint moon computarizada y la ecografa mostraron una pequea cantidad de lquido libre en la pelvis. Sospecho que podra haber tenido un quiste ovrico que se rompi recientemente, ya que no observamos evidencia de quistes ni otras anomalas que puedan causar dolor repentino, y el lquido en la pelvis puede causar irritacin y engineer, mining. Por lo dems, los resultados de los [place] de laboratorio y las imgenes fueron muy tranquilizadores. Para aliviar el dolor, tome ibuprofeno de 600 mg cada 6 horas y paracetamol de 650 mg cada 6 horas. Consulte con su mdico de cabecera si el dolor no desaparece en aproximadamente una semana. Si presenta dolor nuevo o que empeora, u otros sntomas nuevos preocupantes, regrese al servicio de [place].

## 2024-11-30 NOTE — ED Notes (Signed)
Pt to ultrasound by w/c

## 2024-11-30 NOTE — ED Notes (Signed)
The pt returned from ultra sound 

## 2024-11-30 NOTE — ED Triage Notes (Incomplete)
 Pt to ct
